# Patient Record
Sex: Male | Born: 1950 | Race: White | Hispanic: No | Marital: Married | Smoking: Never smoker
Health system: Southern US, Community
[De-identification: ages and names within clinical notes are randomized; demographics above are authoritative.]

## PROBLEM LIST (undated history)

## (undated) DIAGNOSIS — M722 Plantar fascial fibromatosis: Secondary | ICD-10-CM

## (undated) DIAGNOSIS — I82409 Acute embolism and thrombosis of unspecified deep veins of unspecified lower extremity: Secondary | ICD-10-CM

## (undated) DIAGNOSIS — I639 Cerebral infarction, unspecified: Secondary | ICD-10-CM

## (undated) DIAGNOSIS — I6529 Occlusion and stenosis of unspecified carotid artery: Secondary | ICD-10-CM

## (undated) DIAGNOSIS — K5790 Diverticulosis of intestine, part unspecified, without perforation or abscess without bleeding: Secondary | ICD-10-CM

## (undated) DIAGNOSIS — M7542 Impingement syndrome of left shoulder: Secondary | ICD-10-CM

## (undated) DIAGNOSIS — D6851 Activated protein C resistance: Secondary | ICD-10-CM

## (undated) DIAGNOSIS — E785 Hyperlipidemia, unspecified: Secondary | ICD-10-CM

## (undated) HISTORY — PX: VASECTOMY: SHX75

---

## 2006-11-21 ENCOUNTER — Emergency Department: Payer: Self-pay | Admitting: Unknown Physician Specialty

## 2008-03-28 ENCOUNTER — Emergency Department: Payer: Self-pay | Admitting: Emergency Medicine

## 2008-04-01 ENCOUNTER — Ambulatory Visit: Payer: Self-pay | Admitting: Internal Medicine

## 2008-04-06 ENCOUNTER — Ambulatory Visit: Payer: Self-pay | Admitting: Internal Medicine

## 2008-04-12 ENCOUNTER — Ambulatory Visit: Payer: Self-pay | Admitting: Internal Medicine

## 2008-04-18 ENCOUNTER — Ambulatory Visit: Payer: Self-pay | Admitting: Specialist

## 2008-04-20 ENCOUNTER — Ambulatory Visit: Payer: Self-pay | Admitting: Gastroenterology

## 2008-08-11 ENCOUNTER — Ambulatory Visit: Payer: Self-pay | Admitting: Specialist

## 2008-11-08 ENCOUNTER — Ambulatory Visit: Payer: Self-pay | Admitting: Specialist

## 2009-03-13 ENCOUNTER — Ambulatory Visit: Payer: Self-pay | Admitting: Specialist

## 2009-09-15 ENCOUNTER — Ambulatory Visit: Payer: Self-pay | Admitting: Specialist

## 2011-07-03 ENCOUNTER — Ambulatory Visit: Payer: Self-pay

## 2015-03-17 ENCOUNTER — Encounter
Admission: RE | Disposition: A | Discharge status: Home Health | Payer: Self-pay | Source: Ambulatory Visit | Attending: Gastroenterology

## 2015-03-17 ENCOUNTER — Ambulatory Visit
Admission: RE | Admit: 2015-03-17 | Discharge: 2015-03-17 | Disposition: A | Discharge status: Home Health | Payer: 59 | Source: Ambulatory Visit | Attending: Gastroenterology | Admitting: Gastroenterology

## 2015-03-17 ENCOUNTER — Encounter: Payer: Self-pay | Admitting: *Deleted

## 2015-03-17 ENCOUNTER — Ambulatory Visit: Payer: 59 | Admitting: Anesthesiology

## 2015-03-17 DIAGNOSIS — K635 Polyp of colon: Secondary | ICD-10-CM | POA: Diagnosis not present

## 2015-03-17 DIAGNOSIS — M722 Plantar fascial fibromatosis: Secondary | ICD-10-CM | POA: Insufficient documentation

## 2015-03-17 DIAGNOSIS — Z79899 Other long term (current) drug therapy: Secondary | ICD-10-CM | POA: Diagnosis not present

## 2015-03-17 DIAGNOSIS — E785 Hyperlipidemia, unspecified: Secondary | ICD-10-CM | POA: Diagnosis not present

## 2015-03-17 DIAGNOSIS — Z1211 Encounter for screening for malignant neoplasm of colon: Secondary | ICD-10-CM | POA: Insufficient documentation

## 2015-03-17 DIAGNOSIS — K5730 Diverticulosis of large intestine without perforation or abscess without bleeding: Secondary | ICD-10-CM | POA: Diagnosis not present

## 2015-03-17 DIAGNOSIS — K648 Other hemorrhoids: Secondary | ICD-10-CM | POA: Insufficient documentation

## 2015-03-17 HISTORY — DX: Plantar fascial fibromatosis: M72.2

## 2015-03-17 HISTORY — DX: Hyperlipidemia, unspecified: E78.5

## 2015-03-17 HISTORY — PX: COLONOSCOPY WITH PROPOFOL: SHX5780

## 2015-03-17 SURGERY — COLONOSCOPY WITH PROPOFOL
Anesthesia: General

## 2015-03-17 MED ORDER — PROPOFOL INFUSION 10 MG/ML OPTIME
INTRAVENOUS | Status: DC | PRN
Start: 2015-03-17 — End: 2015-03-17
  Administered 2015-03-17: 120 ug/kg/min via INTRAVENOUS

## 2015-03-17 MED ORDER — SODIUM CHLORIDE 0.9 % IV SOLN
INTRAVENOUS | Status: DC
  Administered 2015-03-17: 09:00:00 via INTRAVENOUS

## 2015-03-17 MED ORDER — LIDOCAINE HCL (CARDIAC) 20 MG/ML IV SOLN
INTRAVENOUS | Status: DC | PRN
  Administered 2015-03-17: 60 mg via INTRAVENOUS

## 2015-03-17 MED ORDER — PROPOFOL 10 MG/ML IV BOLUS
INTRAVENOUS | Status: DC | PRN
  Administered 2015-03-17: 60 mg via INTRAVENOUS

## 2015-03-17 NOTE — Transfer of Care (Signed)
Immediate Anesthesia Transfer of Care Note  Patient: Bryan Birch Sr.  Procedure(s) Performed: Procedure(s): COLONOSCOPY WITH PROPOFOL (N/A)  Patient Location: Endoscopy unit  Anesthesia Type:General  Level of Consciousness: awake, alert , oriented and patient cooperative  Airway & Oxygen Therapy: Patient Spontanous Breathing and Patient connected to nasal cannula oxygen  Post-op Assessment: Report given to RN, Post -op Vital signs reviewed and stable and Patient moving all extremities X 4  Post vital signs: Reviewed and stable  Last Vitals:  Filed Vitals:   03/17/15 0932  BP: 117/80  Pulse: 71  Temp: 35.7 C  Resp: 18    Complications: No apparent anesthesia complications

## 2015-03-17 NOTE — Anesthesia Postprocedure Evaluation (Signed)
  Anesthesia Post-op Note  Patient: Bryan Birch Sr.  Procedure(s) Performed: Procedure(s): COLONOSCOPY WITH PROPOFOL (N/A)  Anesthesia type:General  Patient location: PACU  Post pain: Pain level controlled  Post assessment: Post-op Vital signs reviewed, Patient's Cardiovascular Status Stable, Respiratory Function Stable, Patent Airway and No signs of Nausea or vomiting  Post vital signs: Reviewed and stable  Last Vitals:  Filed Vitals:   03/17/15 0932  BP: 117/80  Pulse: 71  Temp: 35.7 C  Resp: 18    Level of consciousness: awake, alert  and patient cooperative  Complications: No apparent anesthesia complications

## 2015-03-17 NOTE — H&P (Signed)
Outpatient short stay form Pre-procedure 03/17/2015 9:05 AM Christena Deem MD  Primary Physician: Dr. Bethann Punches  Reason for visit:  Screening colonoscopy  History of present illness:  Patient is a 63 year old male presenting for his first colonoscopy. His prep well. He takes no aspirin products. Takes no anticoagulates.    Current facility-administered medications:  .  0.9 %  sodium chloride infusion, , Intravenous, Continuous, Christena Deem, MD, Last Rate: 20 mL/hr at 03/17/15 0853 .  0.9 %  sodium chloride infusion, , Intravenous, Continuous, Christena Deem, MD  Prescriptions prior to admission  Medication Sig Dispense Refill Last Dose  . atorvastatin (LIPITOR) 10 MG tablet Take 10 mg by mouth daily.   03/16/2015 at Unknown time  . etodolac (LODINE) 200 MG capsule Take 200 mg by mouth 2 (two) times daily.   03/16/2015 at Unknown time     No Known Allergies   Past Medical History  Diagnosis Date  . Hyperlipidemia   . Plantar fasciitis     Review of systems:      Physical Exam    Heart and lungs: Regular rate and rhythm without rub or gallop, lungs are bilaterally clear    HEENT: Normocephalic atraumatic eyes are anicteric    Other:     Pertinant exam for procedure: Soft nontender nondistended bowel sounds positive normoactive mildly protuberant    Planned proceedures: Colonoscopy and indicated procedures I have discussed the risks benefits and complications of procedures to include not limited to bleeding, infection, perforation and the risk of sedation and the patient wishes to proceed.    Christena Deem, MD Gastroenterology 03/17/2015  9:05 AM

## 2015-03-17 NOTE — Op Note (Signed)
[facility]astroenterology Patient Name: Bryan Holland Procedure Date: 03/17/2015 9:00 AM MRN: [id number] Account #: [id number] Date of Birth: May 23, 1951 Admit Type: Outpatient Age: 64 Room: [facility] ENDO ROOM 3 Gender: Male Note Status: Finalized Procedure:         Colonoscopy Indications:       Screening for colorectal malignant neoplasm, This is the                     patient's first colonoscopy Providers:         Christena Deem, MD Referring MD:      [name], MD (Referring MD) Medicines:         Monitored Anesthesia Care Complications:     No immediate complications. Procedure:         Pre-Anesthesia Assessment:                    - ASA Grade Assessment: II - A patient with mild systemic                     disease.                    After obtaining informed consent, the colonoscope was                     passed under direct vision. Throughout the procedure, the                     patient's blood pressure, pulse, and oxygen saturations                     were monitored continuously. The Colonoscope was                     introduced through the anus and advanced to the the cecum,                     identified by appendiceal orifice and ileocecal valve. The                     colonoscopy was performed without difficulty. The patient                     tolerated the procedure well. The quality of the bowel                     preparation was good. Findings:      A few small-mouthed diverticula were found in the sigmoid colon.      A 3 mm polyp was found at the ileocecal valve. The polyp was sessile.       The polyp was removed with a cold biopsy forceps. Resection and       retrieval were complete.      Non-bleeding internal hemorrhoids were found during retroflexion. The       hemorrhoids were small. Impression:        - Diverticulosis in the sigmoid colon.                    - One 3 mm polyp at the ileocecal valve. Resected and            retrieved.                    - Non-bleeding internal hemorrhoids.  Recommendation:    - Use Analpram HC Cream 2.5%: Apply externally TID for 10                     days. Procedure Code(s): --- Professional ---                    [id number], Colonoscopy, flexible; with biopsy, single or                     multiple CPT copyright 2014 [employer]. All rights reserved. The codes documented in this report are preliminary and upon coder review may  be revised to meet current compliance requirements. Christena Deem, MD 03/17/2015 10:34:20 AM This report has been signed electronically. Number of Addenda: 0 Note Initiated On: 03/17/2015 9:00 AM Scope Withdrawal Time: 0 hours 10 minutes 23 seconds  Total Procedure Duration: 0 hours 15 minutes 23 seconds       [facility]

## 2015-03-17 NOTE — Anesthesia Preprocedure Evaluation (Signed)
Anesthesia Evaluation  Patient identified by MRN, date of birth, ID band Patient awake    Reviewed: Allergy & Precautions, NPO status , Patient's Chart, lab work & pertinent test results  History of Anesthesia Complications Negative for: history of anesthetic complications  Airway Mallampati: II  TM Distance: >3 FB Neck ROM: Full    Dental  (+) Partial Lower, Partial Upper   Pulmonary neg pulmonary ROS,          Cardiovascular negative cardio ROS      Neuro/Psych negative neurological ROS     GI/Hepatic negative GI ROS, Neg liver ROS,   Endo/Other  negative endocrine ROS  Renal/GU negative Renal ROS     Musculoskeletal   Abdominal   Peds  Hematology negative hematology ROS (+)   Anesthesia Other Findings   Reproductive/Obstetrics                             Anesthesia Physical Anesthesia Plan  ASA: II  Anesthesia Plan: General   Post-op Pain Management:    Induction: Intravenous  Airway Management Planned: Nasal Cannula  Additional Equipment:   Intra-op Plan:   Post-operative Plan:   Informed Consent: I have reviewed the patients History and Physical, chart, labs and discussed the procedure including the risks, benefits and alternatives for the proposed anesthesia with the patient or authorized representative who has indicated his/her understanding and acceptance.     Plan Discussed with:   Anesthesia Plan Comments:         Anesthesia Quick Evaluation

## 2015-03-20 LAB — SURGICAL PATHOLOGY

## 2017-06-21 DIAGNOSIS — I639 Cerebral infarction, unspecified: Secondary | ICD-10-CM

## 2017-06-21 HISTORY — DX: Cerebral infarction, unspecified: I63.9

## 2017-07-18 ENCOUNTER — Emergency Department: Payer: Medicare Other

## 2017-07-18 ENCOUNTER — Other Ambulatory Visit: Payer: Self-pay

## 2017-07-18 ENCOUNTER — Inpatient Hospital Stay
Admission: EM | Admit: 2017-07-18 | Discharge: 2017-07-21 | DRG: 065 | Disposition: A | Discharge status: Home / Self Care | Payer: Medicare Other | Attending: Internal Medicine | Admitting: Internal Medicine

## 2017-07-18 ENCOUNTER — Encounter: Payer: Self-pay | Admitting: Emergency Medicine

## 2017-07-18 DIAGNOSIS — I10 Essential (primary) hypertension: Secondary | ICD-10-CM | POA: Diagnosis present

## 2017-07-18 DIAGNOSIS — R29702 NIHSS score 2: Secondary | ICD-10-CM | POA: Diagnosis present

## 2017-07-18 DIAGNOSIS — I69351 Hemiplegia and hemiparesis following cerebral infarction affecting right dominant side: Secondary | ICD-10-CM

## 2017-07-18 DIAGNOSIS — I639 Cerebral infarction, unspecified: Principal | ICD-10-CM | POA: Diagnosis present

## 2017-07-18 DIAGNOSIS — I82412 Acute embolism and thrombosis of left femoral vein: Secondary | ICD-10-CM | POA: Diagnosis present

## 2017-07-18 DIAGNOSIS — E7800 Pure hypercholesterolemia, unspecified: Secondary | ICD-10-CM | POA: Diagnosis present

## 2017-07-18 DIAGNOSIS — E781 Pure hyperglyceridemia: Secondary | ICD-10-CM | POA: Diagnosis present

## 2017-07-18 DIAGNOSIS — I6523 Occlusion and stenosis of bilateral carotid arteries: Secondary | ICD-10-CM | POA: Diagnosis present

## 2017-07-18 DIAGNOSIS — D6851 Activated protein C resistance: Secondary | ICD-10-CM | POA: Diagnosis present

## 2017-07-18 DIAGNOSIS — I739 Peripheral vascular disease, unspecified: Secondary | ICD-10-CM | POA: Diagnosis present

## 2017-07-18 DIAGNOSIS — I63132 Cerebral infarction due to embolism of left carotid artery: Secondary | ICD-10-CM | POA: Diagnosis not present

## 2017-07-18 DIAGNOSIS — R609 Edema, unspecified: Secondary | ICD-10-CM

## 2017-07-18 DIAGNOSIS — I6529 Occlusion and stenosis of unspecified carotid artery: Secondary | ICD-10-CM | POA: Diagnosis present

## 2017-07-18 DIAGNOSIS — I6522 Occlusion and stenosis of left carotid artery: Secondary | ICD-10-CM | POA: Diagnosis not present

## 2017-07-18 HISTORY — DX: Activated protein C resistance: D68.51

## 2017-07-18 HISTORY — DX: Diverticulosis of intestine, part unspecified, without perforation or abscess without bleeding: K57.90

## 2017-07-18 LAB — COMPREHENSIVE METABOLIC PANEL WITH GFR
ALT: 33 U/L (ref 17–63)
AST: 29 U/L (ref 15–41)
Albumin: 4.4 g/dL (ref 3.5–5.0)
Alkaline Phosphatase: 84 U/L (ref 38–126)
Anion gap: 11 (ref 5–15)
BUN: 16 mg/dL (ref 6–20)
CO2: 20 mmol/L — ABNORMAL LOW (ref 22–32)
Calcium: 9.4 mg/dL (ref 8.9–10.3)
Chloride: 105 mmol/L (ref 101–111)
Creatinine, Ser: 1.15 mg/dL (ref 0.61–1.24)
GFR calc Af Amer: >60 mL/min
GFR calc non Af Amer: >60 mL/min
Glucose, Bld: 106 mg/dL — ABNORMAL HIGH (ref 65–99)
Potassium: 4 mmol/L (ref 3.5–5.1)
Sodium: 136 mmol/L (ref 135–145)
Total Bilirubin: 0.4 mg/dL (ref 0.3–1.2)
Total Protein: 7.6 g/dL (ref 6.5–8.1)

## 2017-07-18 LAB — DIFFERENTIAL
Basophils Absolute: 0.1 10*3/uL (ref 0–0.1)
Basophils Relative: 1 %
Eosinophils Absolute: 0.2 10*3/uL (ref 0–0.7)
Eosinophils Relative: 2 %
Lymphocytes Relative: 25 %
Lymphs Abs: 2 10*3/uL (ref 1.0–3.6)
Monocytes Absolute: 0.7 10*3/uL (ref 0.2–1.0)
Monocytes Relative: 9 %
Neutro Abs: 5.1 10*3/uL (ref 1.4–6.5)
Neutrophils Relative %: 63 %

## 2017-07-18 LAB — CBC
HCT: 51.3 % (ref 40.0–52.0)
Hemoglobin: 17.4 g/dL (ref 13.0–18.0)
MCH: 32 pg (ref 26.0–34.0)
MCHC: 33.9 g/dL (ref 32.0–36.0)
MCV: 94.4 fL (ref 80.0–100.0)
Platelets: 197 10*3/uL (ref 150–440)
RBC: 5.43 MIL/uL (ref 4.40–5.90)
RDW: 13.1 % (ref 11.5–14.5)
WBC: 8 10*3/uL (ref 3.8–10.6)

## 2017-07-18 LAB — PROTIME-INR
INR: 0.83
PROTHROMBIN TIME: 11.3 s — AB (ref 11.4–15.2)

## 2017-07-18 LAB — GLUCOSE, CAPILLARY: GLUCOSE-CAPILLARY: 102 mg/dL — AB (ref 65–99)

## 2017-07-18 LAB — APTT: aPTT: 25 s (ref 24–36)

## 2017-07-18 LAB — TROPONIN I: Troponin I: <0.03 ng/mL (ref <0.03)

## 2017-07-18 MED ORDER — ASPIRIN 81 MG PO CHEW
324.0000 mg | CHEWABLE_TABLET | Freq: Once | ORAL | Status: AC
  Administered 2017-07-18: 324 mg via ORAL
  Filled 2017-07-18: qty 4

## 2017-07-18 NOTE — ED Triage Notes (Signed)
See first nurse note. No pain at all. Only deficit is right arm weakness.

## 2017-07-18 NOTE — ED Notes (Signed)
Pt assisted up to bathroom then to w/c. Gait just slightly unsteady. Transported via w/c with telemetry on to room 120 on 1C.

## 2017-07-18 NOTE — ED Notes (Signed)
Patient transported to MRI 

## 2017-07-18 NOTE — H&P (Signed)
[facility] [place] at [facility]   PATIENT NAME: Bryan MethLarry Holland    MR#:  [id number]  DATE OF BIRTH:  [birth year]  DATE OF ADMISSION:  07/18/2017  PRIMARY CARE PHYSICIAN: Danella PentonMiller, Mark F, MD   REQUESTING/REFERRING PHYSICIAN: Lenard LancePaduchowski, MD  CHIEF COMPLAINT:   Chief Complaint  Patient presents with  . Extremity Weakness    HISTORY OF PRESENT ILLNESS:  Bryan Holland  is a 66 y.o. male who presents with right arm weakness.  Patient states that he has been having some intermittent episodes of waxing and waning weakness over the last couple of weeks.  However on the day of presentation in the ED he was unable to lift his arm significantly and unable to use his distal motor function very much at all.  Here in the ED he was found on MRI imaging to have left-sided multiple strokes and watershed regions.  Hospitalist were called for admission  PAST MEDICAL HISTORY:   Past Medical History:  Diagnosis Date  . Diverticulosis   . Factor V Leiden (HCC)   . Hyperlipidemia   . Plantar fasciitis     PAST SURGICAL HISTORY:   Past Surgical History:  Procedure Laterality Date  . COLONOSCOPY WITH PROPOFOL N/A 03/17/2015   Procedure: COLONOSCOPY WITH PROPOFOL;  Surgeon: Christena DeemMartin U Skulskie, MD;  [facility] ENDOSCOPY;  Service: Endoscopy;  Laterality: N/A;  . VASECTOMY      SOCIAL HISTORY:   Social History   Tobacco Use  . Smoking status: Never Smoker  . Smokeless tobacco: Former Engineer, water[employer]  Substance Use Topics  . Alcohol use: Not on file    FAMILY HISTORY:   Family History  Problem Relation Age of Onset  . CAD Mother   . Diabetes Mother   . CAD Father   . CAD Sister     DRUG ALLERGIES:  No Known Allergies  MEDICATIONS AT HOME:   Prior to Admission medications   Medication Sig Start Date End Date Taking? Authorizing Provider  atorvastatin (LIPITOR) 10 MG tablet Take 10 mg by mouth daily.    [provider]    REVIEW OF SYSTEMS:   Review of Systems  Constitutional: Negative for chills, fever, malaise/fatigue and weight loss.  HENT: Negative for ear pain, hearing loss and tinnitus.   Eyes: Negative for blurred vision, double vision, pain and redness.  Respiratory: Negative for cough, hemoptysis and shortness of breath.   Cardiovascular: Negative for chest pain, palpitations, orthopnea and leg swelling.  Gastrointestinal: Negative for abdominal pain, constipation, diarrhea, nausea and vomiting.  Genitourinary: Negative for dysuria, frequency and hematuria.  Musculoskeletal: Negative for back pain, joint pain and neck pain.  Skin:       No acne, rash, or lesions  Neurological: Positive for focal weakness. Negative for dizziness, tremors and weakness.  Endo/Heme/Allergies: Negative for polydipsia. Does not bruise/bleed easily.  Psychiatric/Behavioral: Negative for depression. The patient is not nervous/anxious and does not have insomnia.      VITAL SIGNS:   Vitals:   07/18/17 1636 07/18/17 1930 07/18/17 2100  BP: (!) 162/89 (!) 135/94 (!) 148/97  Pulse: 85 80 81  Resp: 18 15 19   Temp: 98.3 F (36.8 C)    TempSrc: Oral    SpO2: 95% 97% 97%  Weight: 88 kg (194 lb)    Height: 5\' 5"  (1.651 m)     Wt Readings from Last 3 Encounters:  07/18/17 88 kg (194 lb)  03/16/15 78.5 kg (173 lb)    PHYSICAL EXAMINATION:  Physical Exam  Vitals reviewed. Constitutional: He is oriented to person, place, and time. He appears well-developed and well-nourished. No distress.  HENT:  Head: Normocephalic and atraumatic.  Mouth/Throat: Oropharynx is clear and moist.  Eyes: Conjunctivae and EOM are normal. Pupils are equal, round, and reactive to light. No scleral icterus.  Neck: Normal range of motion. Neck supple. No JVD present. No thyromegaly present.  Cardiovascular: Normal rate, regular rhythm and intact distal pulses. Exam reveals no gallop and no friction rub.  No murmur heard. Respiratory: Effort normal and breath  sounds normal. No respiratory distress. He has no wheezes. He has no rales.  GI: Soft. Bowel sounds are normal. He exhibits no distension. There is no tenderness.  Musculoskeletal: Normal range of motion. He exhibits no edema.  No arthritis, no gout  Lymphadenopathy:    He has no cervical adenopathy.  Neurological: He is alert and oriented to person, place, and time. No cranial nerve deficit.  Neurologic: Cranial nerves II-XII intact, Sensation intact to light touch/pinprick, 5/5 strength in all extremities except for right upper extremity which has 3/5 proximal strength and 0/5 distal strength, no dysarthria, no aphasia, no dysphagia, memory intact  Skin: Skin is warm and dry. No rash noted. No erythema.  Psychiatric: He has a normal mood and affect. His behavior is normal. Judgment and thought content normal.    LABORATORY PANEL:   CBC Recent Labs  Lab 07/18/17 1644  WBC 8.0  HGB 17.4  HCT 51.3  PLT 197   ------------------------------------------------------------------------------------------------------------------  Chemistries  Recent Labs  Lab 07/18/17 1644  NA 136  K 4.0  CL 105  CO2 20*  GLUCOSE 106*  BUN 16  CREATININE 1.15  CALCIUM 9.4  AST 29  ALT 33  ALKPHOS 84  BILITOT 0.4   ------------------------------------------------------------------------------------------------------------------  Cardiac Enzymes Recent Labs  Lab 07/18/17 1644  TROPONINI <0.03   ------------------------------------------------------------------------------------------------------------------  RADIOLOGY:  Ct Head Wo Contrast  Result Date: 07/18/2017 CLINICAL DATA:  Progressive right arm weakness for 2 weeks. EXAM: CT HEAD WITHOUT CONTRAST TECHNIQUE: Contiguous axial images were obtained from the base of the skull through the vertex without intravenous contrast. COMPARISON:  07/03/2011 FINDINGS: Brain: There is no evidence of acute large territory infarct, intracranial  hemorrhage, mass, midline shift, or extra-axial fluid collection. The ventricles and sulci are normal. Scattered small foci of cerebral white matter hypodensity are new from the prior study and most notable in the subcortical left parietal lobe and left centrum semiovale. Vascular: No hyperdense vessel. Skull: No fracture or focal osseous lesion. Sinuses/Orbits: Visualized paranasal sinuses and mastoid air cells are clear. Visualized orbits are unremarkable. Other: None. IMPRESSION: 1. No evidence of acute cortically based infarct or intracranial hemorrhage. 2. New small foci of cerebral white matter hypodensity most notable in the left frontoparietal region, nonspecific though may reflect age-indeterminate small vessel ischemia. Electronically Signed   By: Sebastian AcheAllen  Grady M.D.   On: 07/18/2017 17:08   Mr Maxine GlennMra Head Wo Contrast  Result Date: 07/18/2017 CLINICAL DATA:  Right arm weakness EXAM: MRI HEAD WITHOUT CONTRAST MRA HEAD WITHOUT CONTRAST MRA NECK WITHOUT CONTRAST TECHNIQUE: Multiplanar, multiecho pulse sequences of the brain and surrounding structures were obtained without intravenous contrast. Angiographic images of the Circle of Daylyn Christine were obtained using MRA technique without intravenous contrast. Angiographic images of the neck were obtained using MRA technique without intravenous contrast. Carotid stenosis measurements (when applicable) are obtained utilizing NASCET criteria, using the distal internal carotid diameter as the denominator. COMPARISON:  Head CT 07/18/2017  FINDINGS: MRI HEAD FINDINGS Brain: The midline structures are normal. There is multifocal hyperintensity on diffusion-weighted imaging in the left hemisphere, along the margins between the anterior and middle cerebral arteries and margin between the middle and posterior cerebral arteries. ADC map shows varying degrees of corresponding diffusion restriction. There is corresponding hyperintense T2-weighted signal. Otherwise, the brain  parenchyma is normal. No mass lesion. No chronic microhemorrhage or cerebral amyloid angiopathy. No hydrocephalus, age advanced atrophy or lobar predominant volume loss. No dural abnormality or extra-axial collection. Skull and upper cervical spine: The visualized skull base, calvarium, upper cervical spine and extracranial soft tissues are normal. Sinuses/Orbits: No fluid levels or advanced mucosal thickening. No mastoid effusion. Normal orbits. MRA HEAD FINDINGS Intracranial internal carotid arteries: Normal. Anterior cerebral arteries: Normal. Middle cerebral arteries: Normal. Posterior communicating arteries: Present on the right. Posterior cerebral arteries: Normal. Basilar artery: Normal. Vertebral arteries: Left dominant. Normal. Superior cerebellar arteries: Normal. Anterior inferior cerebellar arteries: Poorly visualized. Posterior inferior cerebellar arteries: Normal. MRA NECK FINDINGS There is diminished flow related enhancement within both proximal internal carotid arteries with approximately 75% stenosis. Both vertebral arteries are normal caliber along their entire visualized length. There is a 3 vessel aortic arch branching pattern. IMPRESSION: 1. Numerous foci of acute ischemia within the left hemisphere along the left ACA-MCA and MCA-PCA watershed zones. The pattern is most consistent with a hypotensive/hypoperfusion infarct. 2. No hemorrhage or mass effect. 3. No emergent large vessel occlusion. 4. 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 Confirmation with CT angiography of the neck or carotid ultrasound is recommended, as this study is prone to artifacts. Electronically Signed   By: Deatra Robinson M.D.   On: 07/18/2017 21:22   Mr Maxine Glenn Neck Wo Contrast  Result Date: 07/18/2017 CLINICAL DATA:  Right arm weakness EXAM: MRI HEAD WITHOUT CONTRAST MRA HEAD WITHOUT CONTRAST MRA NECK WITHOUT CONTRAST TECHNIQUE: Multiplanar, multiecho pulse  sequences of the brain and surrounding structures were obtained without intravenous contrast. Angiographic images of the Circle of Nakota Ackert were obtained using MRA technique without intravenous contrast. Angiographic images of the neck were obtained using MRA technique without intravenous contrast. Carotid stenosis measurements (when applicable) are obtained utilizing NASCET criteria, using the distal internal carotid diameter as the denominator. COMPARISON:  Head CT 07/18/2017 FINDINGS: MRI HEAD FINDINGS Brain: The midline structures are normal. There is multifocal hyperintensity on diffusion-weighted imaging in the left hemisphere, along the margins between the anterior and middle cerebral arteries and margin between the middle and posterior cerebral arteries. ADC map shows varying degrees of corresponding diffusion restriction. There is corresponding hyperintense T2-weighted signal. Otherwise, the brain parenchyma is normal. No mass lesion. No chronic microhemorrhage or cerebral amyloid angiopathy. No hydrocephalus, age advanced atrophy or lobar predominant volume loss. No dural abnormality or extra-axial collection. Skull and upper cervical spine: The visualized skull base, calvarium, upper cervical spine and extracranial soft tissues are normal. Sinuses/Orbits: No fluid levels or advanced mucosal thickening. No mastoid effusion. Normal orbits. MRA HEAD FINDINGS Intracranial internal carotid arteries: Normal. Anterior cerebral arteries: Normal. Middle cerebral arteries: Normal. Posterior communicating arteries: Present on the right. Posterior cerebral arteries: Normal. Basilar artery: Normal. Vertebral arteries: Left dominant. Normal. Superior cerebellar arteries: Normal. Anterior inferior cerebellar arteries: Poorly visualized. Posterior inferior cerebellar arteries: Normal. MRA NECK FINDINGS There is diminished flow related enhancement within both proximal internal carotid arteries with approximately 75%  stenosis. Both vertebral arteries are normal caliber along their entire visualized length. There is a 3  vessel aortic arch branching pattern. IMPRESSION: 1. Numerous foci of acute ischemia within the left hemisphere along the left ACA-MCA and MCA-PCA watershed zones. The pattern is most consistent with a hypotensive/hypoperfusion infarct. 2. No hemorrhage or mass effect. 3. No emergent large vessel occlusion. 4. 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 Confirmation with CT angiography of the neck or carotid ultrasound is recommended, as this study is prone to artifacts. Electronically Signed   By: Deatra Robinson M.D.   On: 07/18/2017 21:22   Mr Brain Wo Contrast  Result Date: 07/18/2017 CLINICAL DATA:  Right arm weakness EXAM: MRI HEAD WITHOUT CONTRAST MRA HEAD WITHOUT CONTRAST MRA NECK WITHOUT CONTRAST TECHNIQUE: Multiplanar, multiecho pulse sequences of the brain and surrounding structures were obtained without intravenous contrast. Angiographic images of the Circle of Laury Huizar were obtained using MRA technique without intravenous contrast. Angiographic images of the neck were obtained using MRA technique without intravenous contrast. Carotid stenosis measurements (when applicable) are obtained utilizing NASCET criteria, using the distal internal carotid diameter as the denominator. COMPARISON:  Head CT 07/18/2017 FINDINGS: MRI HEAD FINDINGS Brain: The midline structures are normal. There is multifocal hyperintensity on diffusion-weighted imaging in the left hemisphere, along the margins between the anterior and middle cerebral arteries and margin between the middle and posterior cerebral arteries. ADC map shows varying degrees of corresponding diffusion restriction. There is corresponding hyperintense T2-weighted signal. Otherwise, the brain parenchyma is normal. No mass lesion. No chronic microhemorrhage or cerebral amyloid angiopathy. No  hydrocephalus, age advanced atrophy or lobar predominant volume loss. No dural abnormality or extra-axial collection. Skull and upper cervical spine: The visualized skull base, calvarium, upper cervical spine and extracranial soft tissues are normal. Sinuses/Orbits: No fluid levels or advanced mucosal thickening. No mastoid effusion. Normal orbits. MRA HEAD FINDINGS Intracranial internal carotid arteries: Normal. Anterior cerebral arteries: Normal. Middle cerebral arteries: Normal. Posterior communicating arteries: Present on the right. Posterior cerebral arteries: Normal. Basilar artery: Normal. Vertebral arteries: Left dominant. Normal. Superior cerebellar arteries: Normal. Anterior inferior cerebellar arteries: Poorly visualized. Posterior inferior cerebellar arteries: Normal. MRA NECK FINDINGS There is diminished flow related enhancement within both proximal internal carotid arteries with approximately 75% stenosis. Both vertebral arteries are normal caliber along their entire visualized length. There is a 3 vessel aortic arch branching pattern. IMPRESSION: 1. Numerous foci of acute ischemia within the left hemisphere along the left ACA-MCA and MCA-PCA watershed zones. The pattern is most consistent with a hypotensive/hypoperfusion infarct. 2. No hemorrhage or mass effect. 3. No emergent large vessel occlusion. 4. 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 Confirmation with CT angiography of the neck or carotid ultrasound is recommended, as this study is prone to artifacts. Electronically Signed   By: Deatra Robinson M.D.   On: 07/18/2017 21:22    EKG:   Orders placed or performed during the hospital encounter of 07/18/17  . ED EKG  . ED EKG    IMPRESSION AND PLAN:  Principal Problem:   Stroke [facility]) -admit per stroke admission order set.  Imaging already completed.  We will get appropriate labs, and consults including neurology consult, PT  and OT consults Active Problems:   Carotid artery stenosis -vascular surgery consult for further recommendation for treatment  All the records are reviewed and case discussed with ED provider. Management plans discussed with the patient and/or family.  DVT PROPHYLAXIS: SubQ lovenox  GI PROPHYLAXIS: None  ADMISSION STATUS: Inpatient  CODE  STATUS: Full Code Status History    This patient does not have a recorded code status. Please follow your organizational policy for patients in this situation.      TOTAL TIME TAKING CARE OF THIS PATIENT: 45 minutes.   Shyana Kulakowski FIELDING 07/18/2017, 10:43 PM  [facility]  Office  [phone]  CC: Primary care physician; [name], MD  Note:  This document was prepared using Dragon voice recognition software and may include unintentional dictation errors.

## 2017-07-18 NOTE — ED Notes (Signed)
Patient reports he is unable to raise his right  arm using his elbow more than 18" off the bed while sitting up. Unable to squeeze my hand using his right hand.

## 2017-07-18 NOTE — ED Triage Notes (Signed)
FIRST NURSE NOTE-pt sent from dr Hyacinth Meekermiller. Has no use at all of right arm. No pain just unable to use. Cannot grip.  Sx progressively worse X 2 weeks.  No facial droop.  Apparent weakness noted to right arm. No other weakness.  Seen at urgent care for start of sx and given gabapentin then saw miller today.

## 2017-07-18 NOTE — ED Provider Notes (Addendum)
[facility] Emergency Department Provider Note  Time seen: 5:40 PM  I have reviewed the triage vital signs and the nursing notes.   HISTORY  Chief Complaint Extremity Weakness    HPI Bryan Holland Sr. is a 66 y.o. male with a past medical history of hyperlipidemia who presents to the emergency department for right arm weakness.  According to the patient for the past 2 weeks he has had progressive weakness in the right upper extremity.  He states just yesterday he was still able to put his belt on but with significant difficulty.  Today he has no function in the right upper extremity below the elbow.  Patient states normal sensation.  Denies any headache.  Denies any other weakness or numbness.  Denies any family history of MS.  He denies any prior weakness or numbness symptoms.  Patient's review of systems is negative.   Past Medical History:  Diagnosis Date  . Hyperlipidemia   . Plantar fasciitis     There are no active problems to display for this patient.   Past Surgical History:  Procedure Laterality Date  . COLONOSCOPY WITH PROPOFOL N/A 03/17/2015   Procedure: COLONOSCOPY WITH PROPOFOL;  Surgeon: Christena Deem, MD;  Location: [facility] ENDOSCOPY;  Service: Endoscopy;  Laterality: N/A;  . VASECTOMY      Prior to Admission medications   Medication Sig Start Date End Date Taking? Authorizing Provider  atorvastatin (LIPITOR) 10 MG tablet Take 10 mg by mouth daily.    [provider]  etodolac (LODINE) 200 MG capsule Take 200 mg by mouth 2 (two) times daily.    [provider]    No Known Allergies  History reviewed. No pertinent family history.  Social History Social History   Tobacco Use  . Smoking status: Never Smoker  . Smokeless tobacco: Former Engineer, water Use Topics  . Alcohol use: Not on file  . Drug use: No    Review of Systems Constitutional: Negative for fever. Eyes: Negative for visual changes. ENT:  Negative for congestion Cardiovascular: Negative for chest pain. Respiratory: Negative for shortness of breath. Gastrointestinal: Negative for abdominal pain Genitourinary: Negative for dysuria. Musculoskeletal: Negative for back pain. Skin: Negative for rash. Neurological: Negative for headaches.  Positive for right arm weakness. All other ROS negative  ____________________________________________   PHYSICAL EXAM:  VITAL SIGNS: ED Triage Vitals [07/18/17 1636]  Enc Vitals Group     BP (!) 162/89     Pulse Rate 85     Resp 18     Temp 98.3 F (36.8 C)     Temp Source Oral     SpO2 95 %     Weight 194 lb (88 kg)     Height 5\' 5"  (1.651 m)     Head Circumference      Peak Flow      Pain Score      Pain Loc      Pain Edu?      Excl. in GC?    Constitutional: Alert and oriented. Well appearing and in no distress. Eyes: Normal exam ENT   Head: Normocephalic and atraumatic.   Mouth/Throat: Mucous membranes are moist. Cardiovascular: Normal rate, regular rhythm. No murmur Respiratory: Normal respiratory effort without tachypnea nor retractions. Breath sounds are clear Gastrointestinal: Soft and nontender. No distention.  Musculoskeletal: Nontender with normal range of motion in all extremities.  Neurologic:  Normal speech and language.  Patient has significant weakness of the right upper  extremity, no grip strength on exam, significant drift.  States sensation is normal.  Cranial nerves are intact. Skin:  Skin is warm, dry and intact.  Psychiatric: Mood and affect are normal.   ____________________________________________    EKG  EKG reviewed and interpreted by myself shows normal sinus rhythm at 91 bpm with a narrow QRS, normal axis, normal intervals, no concerning ST changes.  ____________________________________________    RADIOLOGY  CT head shows possible left frontoparietal lesion otherwise negative.  Negative for acute  infarct.  ____________________________________________   INITIAL IMPRESSION / ASSESSMENT AND PLAN / ED COURSE  Pertinent labs & imaging results that were available during my care of the patient were reviewed by me and considered in my medical decision making (see chart for details).  Patient presents to the emergency department for right arm weakness progressively worse for the past 2 weeks.  On exam he has significant right arm weakness otherwise normal neurological examination.  Patient's physical exam is otherwise normal.  Differential would include CVA, ICH, MS, peripheral neuropathy.  Patient denies any pain to the upper extremity.  Patient CT scan of the head is negative for acute infarct but does show a small lesion to the left frontoparietal area.  As the patient symptoms are on the right we will obtain an MRI of the head and neck to further evaluate.  Patient agreeable to this plan of care.  NIH Stroke Scale   Interval: Baseline Time: 9:43 PM Person Administering Scale: Ned Kakar  Administer stroke scale items in the order listed. Record performance in each category after each subscale exam. Do not go back and change scores. Follow directions provided for each exam technique. Scores should reflect what the patient does, not what the clinician thinks the patient can do. The clinician should record answers while administering the exam and work quickly. Except where indicated, the patient should not be coached (i.e., repeated requests to patient to make a special effort).   1a  Level of consciousness: 0=alert; keenly responsive  1b. LOC questions:  0=Performs both tasks correctly  1c. LOC commands: 0=Performs both tasks correctly  2.  Best Gaze: 0=normal  3.  Visual: 0=No visual loss  4. Facial Palsy: 0=Normal symmetric movement  5a.  Motor left arm: 0=No drift, limb holds 90 (or 45) degrees for full 10 seconds  5b.  Motor right arm: 3=No effort against gravity, limb falls   6a. motor left leg: 0=No drift, limb holds 90 (or 45) degrees for full 10 seconds  6b  Motor right leg:  0=No drift, limb holds 90 (or 45) degrees for full 10 seconds  7. Limb Ataxia: 1=Present in one limb  8.  Sensory: 0=Normal; no sensory loss  9. Best Language:  0=No aphasia, normal  10. Dysarthria: 0=Normal  11. Extinction and Inattention: 0=No abnormality  12. Distal motor function: 2=No voluntary extension after 5 seconds. 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  I discussed this with the patient he denies any hypotension known to the patient.  No drug use.  Patient states he has noticed that when he stretches and stretches his head back that he becomes lightheaded in fact one time fell to the ground because of lightheadedness.  Patient does admit to occasional alcohol use.  I would imagine that the patient  could experience/watershed infarcts if he were to fall asleep for a prolonged period in a position that could obstruct flow to the left carotid given the significant stenosis.  Regardless patient will require admission to the hospital for further workup and treatment.  Patient agreeable to this plan of care.  We will cover with aspirin until further recommendations are made.    ____________________________________________   FINAL CLINICAL IMPRESSION(S) / ED DIAGNOSES  Right arm weakness CVA   Minna AntisPaduchowski, Nelia Rogoff, MD 07/18/17 [id number]    Minna AntisPaduchowski, Lattie Riege, MD 07/18/17 2144

## 2017-07-19 ENCOUNTER — Inpatient Hospital Stay: Payer: Medicare Other

## 2017-07-19 DIAGNOSIS — I6522 Occlusion and stenosis of left carotid artery: Secondary | ICD-10-CM

## 2017-07-19 DIAGNOSIS — I639 Cerebral infarction, unspecified: Principal | ICD-10-CM

## 2017-07-19 DIAGNOSIS — I63132 Cerebral infarction due to embolism of left carotid artery: Secondary | ICD-10-CM

## 2017-07-19 LAB — LIPID PANEL
Cholesterol: 260 mg/dL — ABNORMAL HIGH (ref 0–200)
HDL: 45 mg/dL (ref >40)
LDL CALC: 157 mg/dL — AB (ref 0–99)
Total CHOL/HDL Ratio: 5.8 RATIO
Triglycerides: 290 mg/dL — ABNORMAL HIGH (ref <150)
VLDL: 58 mg/dL — AB (ref 0–40)

## 2017-07-19 LAB — HEMOGLOBIN A1C
HEMOGLOBIN A1C: 5.2 % (ref 4.8–5.6)
Mean Plasma Glucose: 102.54 mg/dL

## 2017-07-19 MED ORDER — ASPIRIN EC 81 MG PO TBEC
81.0000 mg | DELAYED_RELEASE_TABLET | Freq: Every day | ORAL | Status: DC
  Administered 2017-07-19: 81 mg via ORAL
  Filled 2017-07-19: qty 1

## 2017-07-19 MED ORDER — ACETAMINOPHEN 650 MG RE SUPP: 650.0000 mg | RECTAL | Status: DC | PRN

## 2017-07-19 MED ORDER — ATORVASTATIN CALCIUM 20 MG PO TABS
40.0000 mg | ORAL_TABLET | Freq: Every day | ORAL | Status: DC
  Administered 2017-07-20 – 2017-07-21 (×2): 40 mg via ORAL
  Filled 2017-07-19 (×2): qty 2

## 2017-07-19 MED ORDER — ENOXAPARIN SODIUM 40 MG/0.4ML ~~LOC~~ SOLN: 40.0000 mg | SUBCUTANEOUS | Status: DC

## 2017-07-19 MED ORDER — ACETAMINOPHEN 325 MG PO TABS
650.0000 mg | ORAL_TABLET | ORAL | Status: DC | PRN
  Administered 2017-07-20 (×2): 650 mg via ORAL
  Filled 2017-07-19 (×2): qty 2

## 2017-07-19 MED ORDER — SODIUM CHLORIDE 0.9 % IV SOLN
INTRAVENOUS | Status: DC
  Administered 2017-07-19: 16:00:00 via INTRAVENOUS

## 2017-07-19 MED ORDER — CLOPIDOGREL BISULFATE 75 MG PO TABS
75.0000 mg | ORAL_TABLET | Freq: Every day | ORAL | Status: DC
  Administered 2017-07-19: 75 mg via ORAL
  Filled 2017-07-19: qty 1

## 2017-07-19 MED ORDER — SODIUM CHLORIDE 0.9% FLUSH
3.0000 mL | Freq: Two times a day (BID) | INTRAVENOUS | Status: DC
  Administered 2017-07-19 – 2017-07-21 (×5): 3 mL via INTRAVENOUS

## 2017-07-19 MED ORDER — STROKE: EARLY STAGES OF RECOVERY BOOK
Freq: Once | Status: AC
  Administered 2017-07-19: 01:00:00

## 2017-07-19 MED ORDER — APIXABAN 5 MG PO TABS
5.0000 mg | ORAL_TABLET | Freq: Two times a day (BID) | ORAL | Status: DC
  Administered 2017-07-19 – 2017-07-21 (×4): 5 mg via ORAL
  Filled 2017-07-19 (×4): qty 1

## 2017-07-19 MED ORDER — ACETAMINOPHEN 160 MG/5ML PO SOLN: 650.0000 mg | ORAL | Status: DC | PRN

## 2017-07-19 MED ORDER — ASPIRIN 325 MG PO TABS
325.0000 mg | ORAL_TABLET | Freq: Every day | ORAL | Status: DC
  Filled 2017-07-19: qty 1

## 2017-07-19 MED ORDER — ATORVASTATIN CALCIUM 20 MG PO TABS
10.0000 mg | ORAL_TABLET | Freq: Every day | ORAL | Status: DC
  Administered 2017-07-19: 08:00:00 10 mg via ORAL
  Filled 2017-07-19: qty 1

## 2017-07-19 NOTE — Evaluation (Signed)
Occupational Therapy Evaluation Patient Details Name: Wynema BirchLarry A Armetta Sr. MRN: [id number] DOB: 26-Jun-1951 Today's Date: 07/19/2017    History of Present Illness Seward MethLarry Homeyer  is a 66 y.o. male who presents with right arm weakness.  Patient states that he has been having some intermittent episodes of waxing and waning weakness over the last couple of weeks.  However on the day of presentation in the ED he was unable to lift his arm significantly and unable to use his distal motor function very much at all.  Here in the ED he was found on MRI imaging to have left-sided multiple strokes and watershed regions.   Clinical Impression   Patient seen for OT evaluation this date.  Patient lives in a 2nd story apartment with his wife in [place], he was previously independent with all tasks including driving and IADLs, he is retired.  Patient was admitted for work up for CVA, presents with hemiplegia/hemiparesis of RUE, denies any other deficits.  Patient presents with limited active movement in RUE throughout, gross motor impaired, ROM impaired, fine motor coordination absent.  Patient does have sensation for light touch and temperature, mild edema present.   Initiation of finger movements and thumb on the right but not functional for any grasp or pinch.  He would benefit from skilled OT to maximize safety and independence in daily tasks.  Recommend OP OT upon discharge for RUE neuro reeducation.     Follow Up Recommendations  Outpatient OT    Equipment Recommendations       Recommendations for Other Services       Precautions / Restrictions Precautions Precaution Comments: Decreased active movement in RUE but still has sensation intact      Mobility Bed Mobility Overal bed mobility: Independent                Transfers                      Balance Overall balance assessment: Independent                                         ADL either performed or  assessed with clinical judgement   ADL Overall ADL's : Needs assistance/impaired   Eating/Feeding Details (indicate cue type and reason): Patient is left handed and can use left hand to perform tasks, unable to perform bilateral hand activities such as cutting food.  Grooming: Minimal assistance Grooming Details (indicate cue type and reason): unable to perform with right hand, able to do limited tasks with left hand Upper Body Bathing: Minimal assistance Upper Body Bathing Details (indicate cue type and reason): right hand unable to use for washing left body, able to use left hand for rest of task.  Lower Body Bathing: Minimal assistance   Upper Body Dressing : Minimal assistance   Lower Body Dressing: Minimal assistance Lower Body Dressing Details (indicate cue type and reason): difficulty with socks, instructed on one handed sock donning and able to complete with cues.  Toilet Transfer: Modified Independent           Functional mobility during ADLs: Independent General ADL Comments: no AD used for mobility.  Deficits are RUE only, patient would benefit from OP OT to further address limitations.      Vision Patient Visual Report: No change from baseline       Perception  Praxis      Pertinent Vitals/Pain Pain Assessment: No/denies pain     Hand Dominance Left   Extremity/Trunk Assessment Upper Extremity Assessment Upper Extremity Assessment: RUE deficits/detail RUE Deficits / Details: Patient demonstrates decreased active movement in RUE as follows:  Shoulder flexion tends to move more in a flex/ABD combo to 90 degrees of motion, elbow flexion to 125 degrees, elbow extension -20 actively.  Full passive ROM in all joints.  Patient has a flicker of movement for finger flexion, no extension.  Thumb has initiation of movement for flexion, extension, ABD but no ADD actively.  Sensation intact but patient reports arm feels heavy and "dead".  Gross motor impaired, fine motor  absent.  Shoulder elevation intact but slow to complete last 1/3 of movement, scapular retraction intact.  No oppositional movements present at eval. Denies any tingling.  Response to Temperature intact.  RUE Coordination: decreased fine motor;decreased gross motor   Lower Extremity Assessment Lower Extremity Assessment: Overall WFL for tasks assessed   Cervical / Trunk Assessment Cervical / Trunk Assessment: Normal   Communication Communication Communication: No difficulties   Cognition Arousal/Alertness: Awake/alert Behavior During Therapy: WFL for tasks assessed/performed Overall Cognitive Status: Within Functional Limits for tasks assessed                                 General Comments: no changes in cognition since event   General Comments       Exercises  Patient seen for self care tasks and instructions on how to incorporate movements on R side during tasks, for example, weight bearing on right UE while at the sink, reaching with right hand to activate faucet lever. Patient instructed on edema control measures with positioning and activation of muscle pump in RUE as much as possible.  Patient educated on using hand as much as possible even if using left UE to assist to take RUE through the motions for daily activities. Patient and wife able to demo understanding and plans to work on exercises over the next few days.    Shoulder Instructions      Home Living Family/patient expects to be discharged to:: Private residence Living Arrangements: Spouse/significant other Available Help at Discharge: Family Type of Home: Apartment Home Access: Other (comment);Stairs to enter [employer] CorporationEntrance Stairs-Number of Steps: lives on 2nd story of apartment with 16 steps to enter no elevator.  Lives in [place]   Home Layout: One level     Bathroom Shower/Tub: Tub/shower unit;Curtain   FirefighterBathroom Toilet: Pharmacist, communitytandard Bathroom Accessibility: Yes   Home Equipment: None           Prior Functioning/Environment Level of Independence: Independent                 OT Problem List: Decreased strength;Decreased knowledge of use of DME or AE;Impaired UE functional use;Increased edema;Decreased range of motion;Decreased coordination      OT Treatment/Interventions: Self-care/ADL training;DME and/or AE instruction;Therapeutic activities;Therapeutic exercise;Neuromuscular education;Patient/family education    OT Goals(Current goals can be found in the care plan section) Acute Rehab OT Goals Patient Stated Goal: to be back to normal, use my right arm again OT Goal Formulation: With patient/family Time For Goal Achievement: 07/26/17 Potential to Achieve Goals: Good ADL Goals Pt Will Perform Upper Body Dressing: (P) with modified independence Pt Will Perform Lower Body Dressing: (P) with modified independence Pt/caregiver will Perform Home Exercise Program: (P) With written HEP provided  OT Frequency:  Min 1X/week   Barriers to D/C:            Co-evaluation              AM-PAC PT "6 Clicks" Daily Activity     Outcome Measure Help from another person eating meals?: A Little Help from another person taking care of personal grooming?: A Little Help from another person toileting, which includes using toliet, bedpan, or urinal?: A Little Help from another person bathing (including washing, rinsing, drying)?: A Little Help from another person to put on and taking off regular upper body clothing?: A Little Help from another person to put on and taking off regular lower body clothing?: A Little 6 Click Score: 18   End of Session    Activity Tolerance: Patient tolerated treatment well Patient left: in bed;with call bell/phone within reach;with family/visitor present  OT Visit Diagnosis: Muscle weakness (generalized) (M62.81);Hemiplegia and hemiparesis;Other (comment)(lack of coordination, ADL deficits) Hemiplegia - Right/Left: Right Hemiplegia -  dominant/non-dominant: Non-Dominant Hemiplegia - caused by: Cerebral infarction                Time: 1200-1240 OT Time Calculation (min): 40 min Charges:  OT General Charges $OT Visit: 1 Visit OT Evaluation $OT Eval Low Complexity: 1 Low OT Treatments $Self Care/Home Management : 23-37 mins G-Codes: OT G-codes **NOT FOR INPATIENT CLASS** Functional Assessment Tool Used: AM-PAC 6 Clicks Daily Activity Functional Limitation: Self care Self Care Current Status (Z6109): At least 40 percent but less than 60 percent impaired, limited or restricted Self Care Goal Status (U0454): At least 20 percent but less than 40 percent impaired, limited or restricted   Amy T Lovett, OTR/L, CLT   Lovett,Amy 07/19/2017, 1:02 PM

## 2017-07-19 NOTE — Progress Notes (Signed)
Patient ID: Bryan BirchLarry A Huhta Sr., male   DOB: [birth year], 66 y.o.   MRN: [id number]  Ultrasound lower extremity positive for acute DVT left lower extremity. Case discussed with Dr. Thad Rangereynolds neurology and she would like to do 5 mg of Eliquis twice daily and not the higher dose at this point because of the acute stroke.  Dr. Alford Highlandichard Rajiv Parlato

## 2017-07-19 NOTE — Evaluation (Signed)
SLP Screening  Patient Details Name: Bryan BirchLarry A Marsch Sr. MRN: [id number] DOB: 1951-07-09  SLP performed screening of pt and discussed with NSG. Pt is at baseline with no complaints of difficulty with speech or swallowing. SLP educated pt and NSG to contact SLP if changes in status occur. No evaluation required at this time.                                                                                                     Bryan PelStacie Harris Holland 07/19/2017, 10:10 AM

## 2017-07-19 NOTE — Consult Note (Signed)
Referring Physician: Renae Gloss    Chief Complaint: Right arm weakness  HPI: Bryan Birch Sr. is a LH 66 y.o. male with a history of factor V leiden and DVT, previously on Coumadin discontinued after a few months, who reports that for the past couple of weeks he has been experiencing right arm weakness.  It has waxed and waned but not been at his baseline for this period of time.  On yesterday he noted that he was unable to lift his right arm well and was unable to perform fine motor movements.  Patient presented to the ED at that time.  Initial NIHSS of 2.   Date last known well: Unable to determine Time last known well: Unable to determine tPA Given: No: Unable to determine LKW, outside treatment window  Past Medical History:  Diagnosis Date  . Diverticulosis   . Factor V Leiden (HCC)   . Hyperlipidemia   . Plantar fasciitis     Past Surgical History:  Procedure Laterality Date  . COLONOSCOPY WITH PROPOFOL N/A 03/17/2015   Procedure: COLONOSCOPY WITH PROPOFOL;  Surgeon: Christena Deem, MD;  Location: [facility] ENDOSCOPY;  Service: Endoscopy;  Laterality: N/A;  . VASECTOMY      Family History  Problem Relation Age of Onset  . CAD Mother   . Diabetes Mother   . CAD Father   . CAD Sister    Social History:  reports that  has never smoked. He has quit using smokeless tobacco. He reports that he does not use drugs. His alcohol history is not on file.  Allergies: No Known Allergies  Medications:  I have reviewed the patient's current medications. Prior to Admission:  Medications Prior to Admission  Medication Sig Dispense Refill Last Dose  . atorvastatin (LIPITOR) 10 MG tablet Take 10 mg by mouth daily.   03/16/2015 at Unknown time   Scheduled: . apixaban  5 mg Oral BID  . aspirin EC  81 mg Oral Daily  . [START ON 07/20/2017] atorvastatin  40 mg Oral Daily  . sodium chloride flush  3 mL Intravenous Q12H    ROS: History obtained from the patient  General ROS: negative for  - chills, fatigue, fever, night sweats, weight gain or weight loss Psychological ROS: negative for - behavioral disorder, hallucinations, memory difficulties, mood swings or suicidal ideation Ophthalmic ROS: negative for - blurry vision, double vision, eye pain or loss of vision ENT ROS: negative for - epistaxis, nasal discharge, oral lesions, sore throat, tinnitus or vertigo Allergy and Immunology ROS: negative for - hives or itchy/watery eyes Hematological and Lymphatic ROS: negative for - bleeding problems, bruising or swollen lymph nodes Endocrine ROS: negative for - galactorrhea, hair pattern changes, polydipsia/polyuria or temperature intolerance Respiratory ROS: negative for - cough, hemoptysis, shortness of breath or wheezing Cardiovascular ROS: negative for - chest pain, dyspnea on exertion, edema or irregular heartbeat Gastrointestinal ROS: negative for - abdominal pain, diarrhea, hematemesis, nausea/vomiting or stool incontinence Genito-Urinary ROS: negative for - dysuria, hematuria, incontinence or urinary frequency/urgency Musculoskeletal ROS: negative for - joint swelling or muscular weakness Neurological ROS: as noted in HPI Dermatological ROS: negative for rash and skin lesion changes  Physical Examination: Blood pressure (!) 148/92, pulse 87, temperature 98 F (36.7 C), temperature source Oral, resp. rate 20, height 5\' 5"  (1.651 m), weight 88 kg (194 lb), SpO2 98 %.  HEENT-  Normocephalic, no lesions, without obvious abnormality.  Normal external eye and conjunctiva.  Normal TM's bilaterally.  Normal auditory canals and  external ears. Normal external nose, mucus membranes and septum.  Normal pharynx. Cardiovascular- S1, S2 normal, pulses palpable throughout   Lungs- chest clear, no wheezing, rales, normal symmetric air entry Abdomen- soft, non-tender; bowel sounds normal; no masses,  no organomegaly Extremities- no edema Lymph-no adenopathy palpable Musculoskeletal-no joint  tenderness, deformity or swelling Skin-warm and dry, no hyperpigmentation, vitiligo, or suspicious lesions  Neurological Examination   Mental Status: Alert, oriented, thought content appropriate.  Speech fluent without evidence of aphasia.  Able to follow 3 step commands without difficulty. Cranial Nerves: II: Discs flat bilaterally; Visual fields grossly normal, pupils equal, round, reactive to light and accommodation III,IV, VI: ptosis not present, extra-ocular motions intact bilaterally V,VII: smile symmetric, facial light touch sensation normal bilaterally VIII: hearing normal bilaterally IX,X: gag reflex present XI: bilateral shoulder shrug XII: midline tongue extension Motor: Right : Upper extremity   3/5 proximally, 0/5 distally   Left:     Upper extremity   5/5  Lower extremity   5/5       Lower extremity   5/5 Tone and bulk:normal tone throughout; no atrophy noted Sensory: Pinprick and light touch intact throughout, bilaterally Deep Tendon Reflexes: 2+ and symmetric with absent AJ's bilaterally Plantars: Right: mute   Left: mute Cerebellar: Normal finger-to-nose testing on the left and normal heel-to-shin testing bilaterally Gait: not tested due to safety concerns    Laboratory Studies:  Basic Metabolic Panel: Recent Labs  Lab 07/18/17 1644  NA 136  K 4.0  CL 105  CO2 20*  GLUCOSE 106*  BUN 16  CREATININE 1.15  CALCIUM 9.4    Liver Function Tests: Recent Labs  Lab 07/18/17 1644  AST 29  ALT 33  ALKPHOS 84  BILITOT 0.4  PROT 7.6  ALBUMIN 4.4   No results for input(s): LIPASE, AMYLASE in the last 168 hours. No results for input(s): AMMONIA in the last 168 hours.  CBC: Recent Labs  Lab 07/18/17 1644  WBC 8.0  NEUTROABS 5.1  HGB 17.4  HCT 51.3  MCV 94.4  PLT 197    Cardiac Enzymes: Recent Labs  Lab 07/18/17 1644  TROPONINI <0.03    BNP: Invalid input(s): POCBNP  CBG: Recent Labs  Lab 07/18/17 1647  GLUCAP 102*     Microbiology: No results found for this or any previous visit.  Coagulation Studies: Recent Labs    07/18/17 1644  LABPROT 11.3*  INR 0.83    Urinalysis: No results for input(s): COLORURINE, LABSPEC, PHURINE, GLUCOSEU, HGBUR, BILIRUBINUR, KETONESUR, PROTEINUR, UROBILINOGEN, NITRITE, LEUKOCYTESUR in the last 168 hours.  Invalid input(s): APPERANCEUR  Lipid Panel:    Component Value Date/Time   CHOL 260 (H) 07/19/2017 0328   TRIG 290 (H) 07/19/2017 0328   HDL 45 07/19/2017 0328   CHOLHDL 5.8 07/19/2017 0328   VLDL 58 (H) 07/19/2017 0328   LDLCALC 157 (H) 07/19/2017 0328    HgbA1C:  Lab Results  Component Value Date   HGBA1C 5.2 07/19/2017    Urine Drug Screen:  No results found for: LABOPIA, COCAINSCRNUR, LABBENZ, AMPHETMU, THCU, LABBARB  Alcohol Level: No results for input(s): ETH in the last 168 hours.  Other results: EKG: normal sinus rhythm at 91 bpm.  Imaging: Ct Head Wo Contrast  Result Date: 07/18/2017 CLINICAL DATA:  Progressive right arm weakness for 2 weeks. EXAM: CT HEAD WITHOUT CONTRAST TECHNIQUE: Contiguous axial images were obtained from the base of the skull through the vertex without intravenous contrast. COMPARISON:  07/03/2011 FINDINGS: Brain: There is no  evidence of acute large territory infarct, intracranial hemorrhage, mass, midline shift, or extra-axial fluid collection. The ventricles and sulci are normal. Scattered small foci of cerebral white matter hypodensity are new from the prior study and most notable in the subcortical left parietal lobe and left centrum semiovale. Vascular: No hyperdense vessel. Skull: No fracture or focal osseous lesion. Sinuses/Orbits: Visualized paranasal sinuses and mastoid air cells are clear. Visualized orbits are unremarkable. Other: None. IMPRESSION: 1. No evidence of acute cortically based infarct or intracranial hemorrhage. 2. New small foci of cerebral white matter hypodensity most notable in the left  frontoparietal region, nonspecific though may reflect age-indeterminate small vessel ischemia. Electronically Signed   By: Sebastian Ache M.D.   On: 07/18/2017 17:08   Mr Maxine Glenn Head Wo Contrast  Result Date: 07/18/2017 CLINICAL DATA:  Right arm weakness EXAM: MRI HEAD WITHOUT CONTRAST MRA HEAD WITHOUT CONTRAST MRA NECK WITHOUT CONTRAST TECHNIQUE: Multiplanar, multiecho pulse sequences of the brain and surrounding structures were obtained without intravenous contrast. Angiographic images of the Circle of Willis were obtained using MRA technique without intravenous contrast. Angiographic images of the neck were obtained using MRA technique without intravenous contrast. Carotid stenosis measurements (when applicable) are obtained utilizing NASCET criteria, using the distal internal carotid diameter as the denominator. COMPARISON:  Head CT 07/18/2017 FINDINGS: MRI HEAD FINDINGS Brain: The midline structures are normal. There is multifocal hyperintensity on diffusion-weighted imaging in the left hemisphere, along the margins between the anterior and middle cerebral arteries and margin between the middle and posterior cerebral arteries. ADC map shows varying degrees of corresponding diffusion restriction. There is corresponding hyperintense T2-weighted signal. Otherwise, the brain parenchyma is normal. No mass lesion. No chronic microhemorrhage or cerebral amyloid angiopathy. No hydrocephalus, age advanced atrophy or lobar predominant volume loss. No dural abnormality or extra-axial collection. Skull and upper cervical spine: The visualized skull base, calvarium, upper cervical spine and extracranial soft tissues are normal. Sinuses/Orbits: No fluid levels or advanced mucosal thickening. No mastoid effusion. Normal orbits. MRA HEAD FINDINGS Intracranial internal carotid arteries: Normal. Anterior cerebral arteries: Normal. Middle cerebral arteries: Normal. Posterior communicating arteries: Present on the right. Posterior  cerebral arteries: Normal. Basilar artery: Normal. Vertebral arteries: Left dominant. Normal. Superior cerebellar arteries: Normal. Anterior inferior cerebellar arteries: Poorly visualized. Posterior inferior cerebellar arteries: Normal. MRA NECK FINDINGS There is diminished flow related enhancement within both proximal internal carotid arteries with approximately 75% stenosis. Both vertebral arteries are normal caliber along their entire visualized length. There is a 3 vessel aortic arch branching pattern. IMPRESSION: 1. Numerous foci of acute ischemia within the left hemisphere along the left ACA-MCA and MCA-PCA watershed zones. The pattern is most consistent with a hypotensive/hypoperfusion infarct. 2. No hemorrhage or mass effect. 3. No emergent large vessel occlusion. 4. 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 Confirmation with CT angiography of the neck or carotid ultrasound is recommended, as this study is prone to artifacts. Electronically Signed   By: Deatra Robinson M.D.   On: 07/18/2017 21:22   Mr Maxine Glenn Neck Wo Contrast  Result Date: 07/18/2017 CLINICAL DATA:  Right arm weakness EXAM: MRI HEAD WITHOUT CONTRAST MRA HEAD WITHOUT CONTRAST MRA NECK WITHOUT CONTRAST TECHNIQUE: Multiplanar, multiecho pulse sequences of the brain and surrounding structures were obtained without intravenous contrast. Angiographic images of the Circle of Willis were obtained using MRA technique without intravenous contrast. Angiographic images of the neck were obtained using MRA technique without intravenous contrast. Carotid stenosis measurements (when applicable)  are obtained utilizing NASCET criteria, using the distal internal carotid diameter as the denominator. COMPARISON:  Head CT 07/18/2017 FINDINGS: MRI HEAD FINDINGS Brain: The midline structures are normal. There is multifocal hyperintensity on diffusion-weighted imaging in the left hemisphere, along the  margins between the anterior and middle cerebral arteries and margin between the middle and posterior cerebral arteries. ADC map shows varying degrees of corresponding diffusion restriction. There is corresponding hyperintense T2-weighted signal. Otherwise, the brain parenchyma is normal. No mass lesion. No chronic microhemorrhage or cerebral amyloid angiopathy. No hydrocephalus, age advanced atrophy or lobar predominant volume loss. No dural abnormality or extra-axial collection. Skull and upper cervical spine: The visualized skull base, calvarium, upper cervical spine and extracranial soft tissues are normal. Sinuses/Orbits: No fluid levels or advanced mucosal thickening. No mastoid effusion. Normal orbits. MRA HEAD FINDINGS Intracranial internal carotid arteries: Normal. Anterior cerebral arteries: Normal. Middle cerebral arteries: Normal. Posterior communicating arteries: Present on the right. Posterior cerebral arteries: Normal. Basilar artery: Normal. Vertebral arteries: Left dominant. Normal. Superior cerebellar arteries: Normal. Anterior inferior cerebellar arteries: Poorly visualized. Posterior inferior cerebellar arteries: Normal. MRA NECK FINDINGS There is diminished flow related enhancement within both proximal internal carotid arteries with approximately 75% stenosis. Both vertebral arteries are normal caliber along their entire visualized length. There is a 3 vessel aortic arch branching pattern. IMPRESSION: 1. Numerous foci of acute ischemia within the left hemisphere along the left ACA-MCA and MCA-PCA watershed zones. The pattern is most consistent with a hypotensive/hypoperfusion infarct. 2. No hemorrhage or mass effect. 3. No emergent large vessel occlusion. 4. 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 Confirmation with CT angiography of the neck or carotid ultrasound is recommended, as this study is prone to artifacts. Electronically  Signed   By: Deatra RobinsonKevin  Herman M.D.   On: 07/18/2017 21:22   Mr Brain Wo Contrast  Result Date: 07/18/2017 CLINICAL DATA:  Right arm weakness EXAM: MRI HEAD WITHOUT CONTRAST MRA HEAD WITHOUT CONTRAST MRA NECK WITHOUT CONTRAST TECHNIQUE: Multiplanar, multiecho pulse sequences of the brain and surrounding structures were obtained without intravenous contrast. Angiographic images of the Circle of Willis were obtained using MRA technique without intravenous contrast. Angiographic images of the neck were obtained using MRA technique without intravenous contrast. Carotid stenosis measurements (when applicable) are obtained utilizing NASCET criteria, using the distal internal carotid diameter as the denominator. COMPARISON:  Head CT 07/18/2017 FINDINGS: MRI HEAD FINDINGS Brain: The midline structures are normal. There is multifocal hyperintensity on diffusion-weighted imaging in the left hemisphere, along the margins between the anterior and middle cerebral arteries and margin between the middle and posterior cerebral arteries. ADC map shows varying degrees of corresponding diffusion restriction. There is corresponding hyperintense T2-weighted signal. Otherwise, the brain parenchyma is normal. No mass lesion. No chronic microhemorrhage or cerebral amyloid angiopathy. No hydrocephalus, age advanced atrophy or lobar predominant volume loss. No dural abnormality or extra-axial collection. Skull and upper cervical spine: The visualized skull base, calvarium, upper cervical spine and extracranial soft tissues are normal. Sinuses/Orbits: No fluid levels or advanced mucosal thickening. No mastoid effusion. Normal orbits. MRA HEAD FINDINGS Intracranial internal carotid arteries: Normal. Anterior cerebral arteries: Normal. Middle cerebral arteries: Normal. Posterior communicating arteries: Present on the right. Posterior cerebral arteries: Normal. Basilar artery: Normal. Vertebral arteries: Left dominant. Normal. Superior  cerebellar arteries: Normal. Anterior inferior cerebellar arteries: Poorly visualized. Posterior inferior cerebellar arteries: Normal. MRA NECK FINDINGS There is diminished flow related enhancement within both proximal internal carotid arteries with  approximately 75% stenosis. Both vertebral arteries are normal caliber along their entire visualized length. There is a 3 vessel aortic arch branching pattern. IMPRESSION: 1. Numerous foci of acute ischemia within the left hemisphere along the left ACA-MCA and MCA-PCA watershed zones. The pattern is most consistent with a hypotensive/hypoperfusion infarct. 2. No hemorrhage or mass effect. 3. No emergent large vessel occlusion. 4. 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 Confirmation with CT angiography of the neck or carotid ultrasound is recommended, as this study is prone to artifacts. Electronically Signed   By: Deatra RobinsonKevin  Herman M.D.   On: 07/18/2017 21:22    Assessment: 66 y.o. male presenting with RUE weakness.  MRI of the brain reviewed and shows multiple small left watershed infarcts.  Likey embolic in etiology.  Patient with a history of Factor V Leiden on no anticoagulation or antiplatelet therapy at home.  MRA shows 75% stenosis of the carotids bilaterally.  Echocardiogram pending.  A1c 5.2.  LDL 157.  Further work up recommended.    Stroke Risk Factors - hypercoagulable state and hyperlipidemia  Plan: 1. PT consult, OT consult, Speech consult 2. Echocardiogram, bubble study 3. Prophylactic therapy-ASA 81mg  daily and Plavix 75mg  daily.  Will make determination as to need for anticoagulation after work up complete.   4. BLE dopplers 5. Telemetry monitoring 6. Frequent neuro checks 7. Vascular consult 8. Statin for lipid management with target LDL<70.   Thana FarrLeslie Malijah Lietz, MD Neurology [phone] 07/19/2017, 2:44 PM

## 2017-07-19 NOTE — Progress Notes (Signed)
PT Cancellation Note  Patient Details Name: Bryan BirchLarry A Klasen Sr. MRN: [id number] DOB: 07-09-1951   Cancelled Treatment:    Reason Eval/Treat Not Completed: Other (comment).  Pt feels he is getting around fine but RUE is extremely weak.  Consult placed for OT and will ck tomorrow to see if he is still fine and sign off orders.   Ivar DrapeRuth E Soundra Lampley 07/19/2017, 11:37 AM   Samul Dadauth Mercie Balsley, PT MS Acute Rehab Dept. Number: [facility] [id number] and [facility] [phone]

## 2017-07-19 NOTE — Consult Note (Signed)
[facility] [place]  Patient name: Bryan MCEACHIN Sr. MRN: [id number] DOB: [birth year] Sex: male   REQUESTING PROVIDER:    Dr. Renae Gloss   REASON FOR CONSULT:    stroke  HISTORY OF PRESENT ILLNESS:   Bryan YONKER Sr. is a 66 y.o. male, who presented to the emergency department yesterday with right arm weakness.  He reports having several episodes over the past several weeks, however on the day of presentation, he could not use his right hand.  He denies any other symptoms of right leg weakness, slurred speech or amaurosis fugax.  The patient does have a history of factor V Leiden mutation.  He has what sounds like a superficial thrombophlebitis in the past for which he was anticoagulated for.  He suffers from hypercholesterolemia.  There is a family history for early cardiac disease in his sister and father.  His father had a heart attack in his 67s as did his sister.  He is a non-smoker.  He denies any history of cardiac disease.  He denies any chest pain or shortness of breath.  PAST MEDICAL HISTORY    Past Medical History:  Diagnosis Date  . Diverticulosis   . Factor V Leiden (HCC)   . Hyperlipidemia   . Plantar fasciitis      FAMILY HISTORY   Family History  Problem Relation Age of Onset  . CAD Mother   . Diabetes Mother   . CAD Father   . CAD Sister     SOCIAL HISTORY:   Social History   Socioeconomic History  . Marital status: Married    Spouse name: Not on file  . Number of children: Not on file  . Years of education: Not on file  . Highest education level: Not on file  Social Needs  . Financial resource strain: Not on file  . Food insecurity - worry: Not on file  . Food insecurity - inability: Not on file  . Transportation needs - medical: Not on file  . Transportation needs - non-medical: Not on file  Occupational History  . Not on file  Tobacco Use  . Smoking status: Never Smoker  .  Smokeless tobacco: Former Engineer, water and Sexual Activity  . Alcohol use: Not on file  . Drug use: No  . Sexual activity: Not on file  Other Topics Concern  . Not on file  Social History Narrative  . Not on file    ALLERGIES:    No Known Allergies  CURRENT MEDICATIONS:    Current Facility-Administered Medications  Medication Dose Route Frequency Provider Last Rate Last Dose  . 0.9 %  sodium chloride infusion   Intravenous Continuous Alford Highland, MD 50 mL/hr at 07/19/17 1558    . acetaminophen (TYLENOL) tablet 650 mg  650 mg Oral Q4H PRN Oralia Manis, MD       Or  . acetaminophen (TYLENOL) suppository 650 mg  650 mg Rectal Q4H PRN Oralia Manis, MD      . apixaban Everlene Balls) tablet 5 mg  5 mg Oral BID Alford Highland, MD      . aspirin EC tablet 81 mg  81 mg Oral Daily Thana Farr, MD   81 mg at 07/19/17 1451  . [START ON 07/20/2017] atorvastatin (LIPITOR) tablet 40 mg  40 mg Oral Daily Wieting, Richard, MD      . sodium chloride flush (NS) 0.9 % injection 3 mL  3 mL Intravenous Q12H Alford Highland, MD  3 mL at 07/19/17 0951    REVIEW OF SYSTEMS:   [X]  denotes positive finding, [ ]  denotes negative finding Cardiac  Comments:  Chest pain or chest pressure:    Shortness of breath upon exertion:    Short of breath when lying flat:    Irregular heart rhythm:        Vascular    Pain in calf, thigh, or hip brought on by ambulation:    Pain in feet at night that wakes you up from your sleep:     Blood clot in your veins:    Leg swelling:         Pulmonary    Oxygen at home:    Productive cough:     Wheezing:         Neurologic    Sudden weakness in arms or legs:  x   Sudden numbness in arms or legs:     Sudden onset of difficulty speaking or slurred speech:    Temporary loss of vision in one eye:     Problems with dizziness:         Gastrointestinal    Blood in stool:      Vomited blood:         Genitourinary    Burning when urinating:       Blood in urine:        Psychiatric    Major depression:         Hematologic    Bleeding problems:    Problems with blood clotting too easily:        Skin    Rashes or ulcers:        Constitutional    Fever or chills:     PHYSICAL EXAM:   Vitals:   07/19/17 0750 07/19/17 0954 07/19/17 1215 07/19/17 1603  BP: (!) 154/88 (!) 151/89 (!) 148/92 129/90  Pulse: 77 84 87 79  Resp: 20 20 20 20   Temp: 97.7 F (36.5 C) 98.2 F (36.8 C) 98 F (36.7 C) 98 F (36.7 C)  TempSrc: Oral Oral Oral Oral  SpO2: 93% 95% 98% 97%  Weight:      Height:        GENERAL: The patient is a well-nourished male, in no acute distress. The vital signs are documented above. CARDIAC: There is a regular rate and rhythm.  PULMONARY: Nonlabored respirations ABDOMEN: Soft and non-tender with normal pitched bowel sounds.  MUSCULOSKELETAL: There are no major deformities or cyanosis. NEUROLOGIC: Loss of motor function to the right arm SKIN: There are no ulcers or rashes noted. PSYCHIATRIC: The patient has a normal affect.  STUDIES:   I have reviewed the MRI of the head and neck with the following findings  1. Numerous foci of acute ischemia within the left hemisphere along the left ACA-MCA and MCA-PCA watershed zones. The pattern is most consistent with a hypotensive/hypoperfusion infarct. 2. No hemorrhage or mass effect. 3. No emergent large vessel occlusion. 4. 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 Confirmation with CT angiography of the neck or carotid ultrasound is recommended, as this study is prone to artifacts.  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  A CT angiogram of the head and neck is recommended and has been ordered for tomorrow.  I discussed with the patient that given the  severity  of his right arm paralysis, I would like for him to continue with rehabilitation for 1-3 weeks prior to any surgical intervention.  Aspirin and Plavix are recommended as well as high-dose, statin therapy.  I will follow-up tomorrow after his CT angiogram has been performed.   Durene CalWells Brabham, MD [facility] Tel [phone] Pager [phone]

## 2017-07-19 NOTE — Progress Notes (Signed)
Pt holds right arm up for NIH test, but then has to ":drop" it because it gets "too heavy."; no grip. No there deficits noted. OT consult requested and done. Neurochecks/vs's stable. Wife in.

## 2017-07-19 NOTE — Progress Notes (Signed)
Patient ID: Bryan BirchLarry A Okubo Sr., male   DOB: 1951-06-29, 66 y.o.   MRN: [id number]  [facility] PROGRESS NOTE  Bryan BirchLarry A Kawai Sr. MVH:[id number] DOB: 1951-06-29 DOA: 07/18/2017 PCP: Danella PentonMiller, Mark F, MD  HPI/Subjective: Patient came in with inability to move his right arm very well for the past week.  Found to have stroke and watershed area.  Objective: Vitals:   07/19/17 0954 07/19/17 1215  BP: (!) 151/89 (!) 148/92  Pulse: 84 87  Resp: 20 20  Temp: 98.2 F (36.8 C) 98 F (36.7 C)  SpO2: 95% 98%    Filed Weights   07/18/17 1636  Weight: 88 kg (194 lb)    ROS: Review of Systems  Constitutional: Negative for chills and fever.  Eyes: Negative for blurred vision.  Respiratory: Negative for cough and shortness of breath.   Cardiovascular: Negative for chest pain.  Gastrointestinal: Negative for abdominal pain, constipation, diarrhea, nausea and vomiting.  Genitourinary: Negative for dysuria.  Musculoskeletal: Negative for joint pain.  Neurological: Positive for focal weakness. Negative for dizziness and headaches.   Exam: Physical Exam  Constitutional: He is oriented to person, place, and time.  HENT:  Nose: No mucosal edema.  Mouth/Throat: No oropharyngeal exudate or posterior oropharyngeal edema.  Eyes: Conjunctivae, EOM and lids are normal. Pupils are equal, round, and reactive to light.  Neck: No JVD present. Carotid bruit is not present. No edema present. No thyroid mass and no thyromegaly present.  Cardiovascular: S1 normal and S2 normal. Exam reveals no gallop.  No murmur heard. Pulses:      Dorsalis pedis pulses are 2+ on the right side, and 2+ on the left side.  Respiratory: No respiratory distress. He has no wheezes. He has no rhonchi. He has no rales.  GI: Soft. Bowel sounds are normal. There is no tenderness.  Musculoskeletal:       Right ankle: He exhibits no swelling.       Left ankle: He exhibits no swelling.  Lymphadenopathy:    He has no  cervical adenopathy.  Neurological: He is alert and oriented to person, place, and time. No cranial nerve deficit.  Right arm weakness.  Able to flex a little bit at the shoulder and elbow.  Slightly able to extend at the right wrist.  Unable to squeeze with his hand.  Skin: Skin is warm. No rash noted. Nails show no clubbing.  Psychiatric: He has a normal mood and affect.      Data Reviewed: Basic Metabolic Panel: Recent Labs  Lab 07/18/17 1644  NA 136  K 4.0  CL 105  CO2 20*  GLUCOSE 106*  BUN 16  CREATININE 1.15  CALCIUM 9.4   Liver Function Tests: Recent Labs  Lab 07/18/17 1644  AST 29  ALT 33  ALKPHOS 84  BILITOT 0.4  PROT 7.6  ALBUMIN 4.4   CBC: Recent Labs  Lab 07/18/17 1644  WBC 8.0  NEUTROABS 5.1  HGB 17.4  HCT 51.3  MCV 94.4  PLT 197   Cardiac Enzymes: Recent Labs  Lab 07/18/17 1644  TROPONINI <0.03    CBG: Recent Labs  Lab 07/18/17 1647  GLUCAP 102*      Studies: Ct Head Wo Contrast  Result Date: 07/18/2017 CLINICAL DATA:  Progressive right arm weakness for 2 weeks. EXAM: CT HEAD WITHOUT CONTRAST TECHNIQUE: Contiguous axial images were obtained from the base of the skull through the vertex without intravenous contrast. COMPARISON:  07/03/2011 FINDINGS: Brain: There is no evidence of  acute large territory infarct, intracranial hemorrhage, mass, midline shift, or extra-axial fluid collection. The ventricles and sulci are normal. Scattered small foci of cerebral white matter hypodensity are new from the prior study and most notable in the subcortical left parietal lobe and left centrum semiovale. Vascular: No hyperdense vessel. Skull: No fracture or focal osseous lesion. Sinuses/Orbits: Visualized paranasal sinuses and mastoid air cells are clear. Visualized orbits are unremarkable. Other: None. IMPRESSION: 1. No evidence of acute cortically based infarct or intracranial hemorrhage. 2. New small foci of cerebral white matter hypodensity most  notable in the left frontoparietal region, nonspecific though may reflect age-indeterminate small vessel ischemia. Electronically Signed   By: Sebastian Ache M.D.   On: 07/18/2017 17:08   Mr Maxine Glenn Head Wo Contrast  Result Date: 07/18/2017 CLINICAL DATA:  Right arm weakness EXAM: MRI HEAD WITHOUT CONTRAST MRA HEAD WITHOUT CONTRAST MRA NECK WITHOUT CONTRAST TECHNIQUE: Multiplanar, multiecho pulse sequences of the brain and surrounding structures were obtained without intravenous contrast. Angiographic images of the Circle of Willis were obtained using MRA technique without intravenous contrast. Angiographic images of the neck were obtained using MRA technique without intravenous contrast. Carotid stenosis measurements (when applicable) are obtained utilizing NASCET criteria, using the distal internal carotid diameter as the denominator. COMPARISON:  Head CT 07/18/2017 FINDINGS: MRI HEAD FINDINGS Brain: The midline structures are normal. There is multifocal hyperintensity on diffusion-weighted imaging in the left hemisphere, along the margins between the anterior and middle cerebral arteries and margin between the middle and posterior cerebral arteries. ADC map shows varying degrees of corresponding diffusion restriction. There is corresponding hyperintense T2-weighted signal. Otherwise, the brain parenchyma is normal. No mass lesion. No chronic microhemorrhage or cerebral amyloid angiopathy. No hydrocephalus, age advanced atrophy or lobar predominant volume loss. No dural abnormality or extra-axial collection. Skull and upper cervical spine: The visualized skull base, calvarium, upper cervical spine and extracranial soft tissues are normal. Sinuses/Orbits: No fluid levels or advanced mucosal thickening. No mastoid effusion. Normal orbits. MRA HEAD FINDINGS Intracranial internal carotid arteries: Normal. Anterior cerebral arteries: Normal. Middle cerebral arteries: Normal. Posterior communicating arteries: Present on  the right. Posterior cerebral arteries: Normal. Basilar artery: Normal. Vertebral arteries: Left dominant. Normal. Superior cerebellar arteries: Normal. Anterior inferior cerebellar arteries: Poorly visualized. Posterior inferior cerebellar arteries: Normal. MRA NECK FINDINGS There is diminished flow related enhancement within both proximal internal carotid arteries with approximately 75% stenosis. Both vertebral arteries are normal caliber along their entire visualized length. There is a 3 vessel aortic arch branching pattern. IMPRESSION: 1. Numerous foci of acute ischemia within the left hemisphere along the left ACA-MCA and MCA-PCA watershed zones. The pattern is most consistent with a hypotensive/hypoperfusion infarct. 2. No hemorrhage or mass effect. 3. No emergent large vessel occlusion. 4. 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 Confirmation with CT angiography of the neck or carotid ultrasound is recommended, as this study is prone to artifacts. Electronically Signed   By: Deatra Robinson M.D.   On: 07/18/2017 21:22   Mr Maxine Glenn Neck Wo Contrast  Result Date: 07/18/2017 CLINICAL DATA:  Right arm weakness EXAM: MRI HEAD WITHOUT CONTRAST MRA HEAD WITHOUT CONTRAST MRA NECK WITHOUT CONTRAST TECHNIQUE: Multiplanar, multiecho pulse sequences of the brain and surrounding structures were obtained without intravenous contrast. Angiographic images of the Circle of Willis were obtained using MRA technique without intravenous contrast. Angiographic images of the neck were obtained using MRA technique without intravenous contrast. Carotid stenosis measurements (when applicable) are obtained  utilizing NASCET criteria, using the distal internal carotid diameter as the denominator. COMPARISON:  Head CT 07/18/2017 FINDINGS: MRI HEAD FINDINGS Brain: The midline structures are normal. There is multifocal hyperintensity on diffusion-weighted imaging in the left  hemisphere, along the margins between the anterior and middle cerebral arteries and margin between the middle and posterior cerebral arteries. ADC map shows varying degrees of corresponding diffusion restriction. There is corresponding hyperintense T2-weighted signal. Otherwise, the brain parenchyma is normal. No mass lesion. No chronic microhemorrhage or cerebral amyloid angiopathy. No hydrocephalus, age advanced atrophy or lobar predominant volume loss. No dural abnormality or extra-axial collection. Skull and upper cervical spine: The visualized skull base, calvarium, upper cervical spine and extracranial soft tissues are normal. Sinuses/Orbits: No fluid levels or advanced mucosal thickening. No mastoid effusion. Normal orbits. MRA HEAD FINDINGS Intracranial internal carotid arteries: Normal. Anterior cerebral arteries: Normal. Middle cerebral arteries: Normal. Posterior communicating arteries: Present on the right. Posterior cerebral arteries: Normal. Basilar artery: Normal. Vertebral arteries: Left dominant. Normal. Superior cerebellar arteries: Normal. Anterior inferior cerebellar arteries: Poorly visualized. Posterior inferior cerebellar arteries: Normal. MRA NECK FINDINGS There is diminished flow related enhancement within both proximal internal carotid arteries with approximately 75% stenosis. Both vertebral arteries are normal caliber along their entire visualized length. There is a 3 vessel aortic arch branching pattern. IMPRESSION: 1. Numerous foci of acute ischemia within the left hemisphere along the left ACA-MCA and MCA-PCA watershed zones. The pattern is most consistent with a hypotensive/hypoperfusion infarct. 2. No hemorrhage or mass effect. 3. No emergent large vessel occlusion. 4. 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 Confirmation with CT angiography of the neck or carotid ultrasound is recommended, as this study is prone to  artifacts. Electronically Signed   By: Deatra RobinsonKevin  Herman M.D.   On: 07/18/2017 21:22   Mr Brain Wo Contrast  Result Date: 07/18/2017 CLINICAL DATA:  Right arm weakness EXAM: MRI HEAD WITHOUT CONTRAST MRA HEAD WITHOUT CONTRAST MRA NECK WITHOUT CONTRAST TECHNIQUE: Multiplanar, multiecho pulse sequences of the brain and surrounding structures were obtained without intravenous contrast. Angiographic images of the Circle of Willis were obtained using MRA technique without intravenous contrast. Angiographic images of the neck were obtained using MRA technique without intravenous contrast. Carotid stenosis measurements (when applicable) are obtained utilizing NASCET criteria, using the distal internal carotid diameter as the denominator. COMPARISON:  Head CT 07/18/2017 FINDINGS: MRI HEAD FINDINGS Brain: The midline structures are normal. There is multifocal hyperintensity on diffusion-weighted imaging in the left hemisphere, along the margins between the anterior and middle cerebral arteries and margin between the middle and posterior cerebral arteries. ADC map shows varying degrees of corresponding diffusion restriction. There is corresponding hyperintense T2-weighted signal. Otherwise, the brain parenchyma is normal. No mass lesion. No chronic microhemorrhage or cerebral amyloid angiopathy. No hydrocephalus, age advanced atrophy or lobar predominant volume loss. No dural abnormality or extra-axial collection. Skull and upper cervical spine: The visualized skull base, calvarium, upper cervical spine and extracranial soft tissues are normal. Sinuses/Orbits: No fluid levels or advanced mucosal thickening. No mastoid effusion. Normal orbits. MRA HEAD FINDINGS Intracranial internal carotid arteries: Normal. Anterior cerebral arteries: Normal. Middle cerebral arteries: Normal. Posterior communicating arteries: Present on the right. Posterior cerebral arteries: Normal. Basilar artery: Normal. Vertebral arteries: Left dominant.  Normal. Superior cerebellar arteries: Normal. Anterior inferior cerebellar arteries: Poorly visualized. Posterior inferior cerebellar arteries: Normal. MRA NECK FINDINGS There is diminished flow related enhancement within both proximal internal carotid arteries with approximately 75%  stenosis. Both vertebral arteries are normal caliber along their entire visualized length. There is a 3 vessel aortic arch branching pattern. IMPRESSION: 1. Numerous foci of acute ischemia within the left hemisphere along the left ACA-MCA and MCA-PCA watershed zones. The pattern is most consistent with a hypotensive/hypoperfusion infarct. 2. No hemorrhage or mass effect. 3. No emergent large vessel occlusion. 4. 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 Confirmation with CT angiography of the neck or carotid ultrasound is recommended, as this study is prone to artifacts. Electronically Signed   By: Deatra Robinson M.D.   On: 07/18/2017 21:22    Scheduled Meds: . aspirin EC  81 mg Oral Daily  . [START ON 07/20/2017] atorvastatin  40 mg Oral Daily  . clopidogrel  75 mg Oral Daily  . enoxaparin (LOVENOX) injection  40 mg Subcutaneous Q24H  . sodium chloride flush  3 mL Intravenous Q12H   Continuous Infusions: . sodium chloride      Assessment/Plan:  1. 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  Case discussed with neurology.  Aspirin and Plavix prescribed for now.  Start atorvastatin high-dose.  Physical therapy and Occupational Therapy consultations.  Echocardiogram with bubble study must be done prior to disposition. 2. Bilateral carotid stenosis on MRA.  CT angios ordered for tomorrow morning. 3. Hyperlipidemia unspecified.  LDL 157 goal less than 70 4. Hypertriglyceridemia.  Lipitor should help this a little bit too. 5. History of factor V Leiden and DVT.  Ultrasound of the lower extremities.  Likely the patient will need  anticoagulation but will see if vascular wants to do any surgical procedure first.  Code Status:     Code Status Orders  (From admission, onward)        Start     Ordered   07/19/17 0006  Full code  Continuous     07/19/17 0005    Code Status History    Date Active Date Inactive Code Status Order ID Comments User Context   This patient has a current code status but no historical code status.     Family Communication: Wife at bedside Disposition Plan: To be determined  Consultants:  Neurology  Vascular surgery  Time spent: 28 minutes, case discussed with neurology  Alford Highland  [facility]

## 2017-07-20 ENCOUNTER — Inpatient Hospital Stay: Payer: Medicare Other

## 2017-07-20 LAB — BASIC METABOLIC PANEL
Anion gap: 6 (ref 5–15)
BUN: 15 mg/dL (ref 6–20)
CALCIUM: 8.7 mg/dL — AB (ref 8.9–10.3)
CHLORIDE: 107 mmol/L (ref 101–111)
CO2: 25 mmol/L (ref 22–32)
CREATININE: 0.99 mg/dL (ref 0.61–1.24)
GFR calc non Af Amer: >60 mL/min (ref >60)
Glucose, Bld: 100 mg/dL — ABNORMAL HIGH (ref 65–99)
Potassium: 4.1 mmol/L (ref 3.5–5.1)
Sodium: 138 mmol/L (ref 135–145)

## 2017-07-20 MED ORDER — IOPAMIDOL (ISOVUE-370) INJECTION 76%
75.0000 mL | Freq: Once | INTRAVENOUS | Status: AC | PRN
  Administered 2017-07-20: 75 mL via INTRAVENOUS

## 2017-07-20 NOTE — Plan of Care (Signed)
Alert and oriented pt. No complaints of pain or discomfort. Up in room independently. Still with weakness to the right arm, but improved.

## 2017-07-20 NOTE — Plan of Care (Signed)
CT carotid show severe bilateral stenosis with pt and wife aware. MD advised pt/wife of Left leg DVT and need for long term anticoagulant therapy. BP slightly more elevated with IVF's discontinued after hydration for CT scan done. Neurochecks stable with slight improvement in sensation right hand, pt able to flex elbow, lift arm better, can make slight right hand grip. Up in room and tolerated well. Awaiting vascular input and ECHO bubble study. Continue eliquis at this time.

## 2017-07-20 NOTE — Progress Notes (Signed)
Patient ID: Bryan Birch Sr., male   DOB: [birth year], 66 y.o.   MRN: [id number]  [facility] PROGRESS NOTE  Bryan VOORHIS Sr. [name] DOB: Aug 05, 1950 DOA: 07/18/2017 PCP: Bryan Penton, MD  HPI/Subjective: Patient able to move his right arm a little bit better today.  Able to flex at the shoulder and at the elbow and able to squeeze a little bit with his fingers today.  Objective: Vitals:   07/20/17 0849 07/20/17 1204  BP: (!) 157/103 (!) 159/103  Pulse: 80 98  Resp: 20 20  Temp: 97.8 F (36.6 C) 98.1 F (36.7 C)  SpO2: 97% 98%    Filed Weights   07/18/17 1636  Weight: 88 kg (194 lb)    ROS: Review of Systems  Constitutional: Negative for chills and fever.  Eyes: Negative for blurred vision.  Respiratory: Negative for cough and shortness of breath.   Cardiovascular: Negative for chest pain.  Gastrointestinal: Negative for abdominal pain, constipation, diarrhea, nausea and vomiting.  Genitourinary: Negative for dysuria.  Musculoskeletal: Negative for joint pain.  Neurological: Positive for focal weakness. Negative for dizziness and headaches.   Exam: Physical Exam  Constitutional: He is oriented to person, place, and time.  HENT:  Nose: No mucosal edema.  Mouth/Throat: No oropharyngeal exudate or posterior oropharyngeal edema.  Eyes: Conjunctivae, EOM and lids are normal. Pupils are equal, round, and reactive to light.  Neck: No JVD present. Carotid bruit is not present. No edema present. No thyroid mass and no thyromegaly present.  Cardiovascular: S1 normal and S2 normal. Exam reveals no gallop.  No murmur heard. Pulses:      Dorsalis pedis pulses are 2+ on the right side, and 2+ on the left side.  Respiratory: No respiratory distress. He has no wheezes. He has no rhonchi. He has no rales.  GI: Soft. Bowel sounds are normal. There is no tenderness.  Musculoskeletal:       Right ankle: He exhibits no swelling.       Left ankle: He exhibits no  swelling.  Lymphadenopathy:    He has no cervical adenopathy.  Neurological: He is alert and oriented to person, place, and time. No cranial nerve deficit.  Patient still with right arm weakness but able to move his right arm better today than yesterday.  Able to flex a little bit more with his shoulder and right elbow.  Patient able to do a grip strength today.  Skin: Skin is warm. No rash noted. Nails show no clubbing.  Psychiatric: He has a normal mood and affect.      Data Reviewed: Basic Metabolic Panel: Recent Labs  Lab 07/18/17 1644 07/20/17 0440  NA 136 138  K 4.0 4.1  CL 105 107  CO2 20* 25  GLUCOSE 106* 100*  BUN 16 15  CREATININE 1.15 0.99  CALCIUM 9.4 8.7*   Liver Function Tests: Recent Labs  Lab 07/18/17 1644  AST 29  ALT 33  ALKPHOS 84  BILITOT 0.4  PROT 7.6  ALBUMIN 4.4   CBC: Recent Labs  Lab 07/18/17 1644  WBC 8.0  NEUTROABS 5.1  HGB 17.4  HCT 51.3  MCV 94.4  PLT 197   Cardiac Enzymes: Recent Labs  Lab 07/18/17 1644  TROPONINI <0.03    CBG: Recent Labs  Lab 07/18/17 1647  GLUCAP 102*      Studies: Ct Head Wo Contrast  Result Date: 07/18/2017 CLINICAL DATA:  Progressive right arm weakness for 2 weeks. EXAM: CT HEAD WITHOUT  CONTRAST TECHNIQUE: Contiguous axial images were obtained from the base of the skull through the vertex without intravenous contrast. COMPARISON:  07/03/2011 FINDINGS: Brain: There is no evidence of acute large territory infarct, intracranial hemorrhage, mass, midline shift, or extra-axial fluid collection. The ventricles and sulci are normal. Scattered small foci of cerebral white matter hypodensity are new from the prior study and most notable in the subcortical left parietal lobe and left centrum semiovale. Vascular: No hyperdense vessel. Skull: No fracture or focal osseous lesion. Sinuses/Orbits: Visualized paranasal sinuses and mastoid air cells are clear. Visualized orbits are unremarkable. Other: None.  IMPRESSION: 1. No evidence of acute cortically based infarct or intracranial hemorrhage. 2. New small foci of cerebral white matter hypodensity most notable in the left frontoparietal region, nonspecific though may reflect age-indeterminate small vessel ischemia. Electronically Signed   By: Sebastian Ache M.D.   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 Factor V Leiden deficiency. EXAM: CT ANGIOGRAPHY NECK TECHNIQUE: Multidetector CT imaging of the neck was performed using the standard protocol during bolus administration of intravenous contrast. Multiplanar CT image reconstructions and MIPs were obtained to evaluate the vascular anatomy. Carotid stenosis measurements (when applicable) are obtained utilizing NASCET criteria, using the distal internal carotid diameter as the denominator. CONTRAST:  75mL ISOVUE-370 IOPAMIDOL (ISOVUE-370) INJECTION 76% COMPARISON:  MRI brain and MRA head and neck in 07/18/2017. FINDINGS: Aortic arch: The a 3 vessel arch configuration is present. There is minimal calcification along the underside of the arch. No significant stenosis or aneurysm is present. Right carotid system: The right common carotid artery is within normal limits. A high-grade, near occlusive, stenosis is present at the origin of the right internal carotid artery. The lumen is not measurable. The more distal right ICA is within normal limits. Focal calcification is present in the petrous segment, just above the skullbase. There is no significant stenosis at that level. Left carotid system: The left common carotid artery is within normal limits. Dense calcifications and soft tissue plaque are present at the bifurcation. There is a high-grade, near occlusive stenosis of the left internal carotid artery with extensive soft plaque extending superiorly over 10 mm. No significant distal  stenosis is present. The distal right internal carotid artery is slightly more narrow than on the left. Additional calcifications are present within the petrous segment without a significant tandem stenosis. Vertebral arteries: The vertebral artery is originate from the subclavian artery is without significant stenosis. Right vertebral artery is slightly dominant to the right. There is no significant stenosis in the neck. Vertebrobasilar junction is normal. PICA origins are visualized and normal bilaterally. Skeleton: Dextroconvex curvature is present in the cervical spine with asymmetric uncovertebral disease on the left at C3-4 and C4-5 in particular. Vertebral body heights are maintained. 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 Other neck: The soft tissues of the neck are otherwise unremarkable. The thyroid is within normal limits. There is no significant cervical adenopathy. Upper chest: The lung apices are clear. IMPRESSION: 1. Severe, near occlusive stenosis of the proximal internal carotid artery is bilaterally. 2. Soft tissue or fibrotic plaque more extensive left than right. This may represent acute soft plaque within the lumen on the left distal to the high-grade stenosis. 3. Slight decreased caliber of the more distal left internal carotid artery compared to the right suggests a greater hemodynamic effect on the left, corresponding to the site  of the patient's recent watershed type infarctions. These results were called by telephone at the time of interpretation on 07/20/2017 at 11:53 am to Drs. Rhema Boyett and Durene Cal , who verbally acknowledged these results. Electronically Signed   By: Marin Roberts M.D.   On: 07/20/2017 12:10   Mr Maxine Glenn Head Wo Contrast  Result Date: 07/18/2017 CLINICAL DATA:  Right arm weakness EXAM: MRI HEAD WITHOUT CONTRAST MRA HEAD WITHOUT CONTRAST MRA NECK WITHOUT CONTRAST TECHNIQUE: Multiplanar, multiecho pulse sequences of the brain and surrounding structures  were obtained without intravenous contrast. Angiographic images of the Circle of Willis were obtained using MRA technique without intravenous contrast. Angiographic images of the neck were obtained using MRA technique without intravenous contrast. Carotid stenosis measurements (when applicable) are obtained utilizing NASCET criteria, using the distal internal carotid diameter as the denominator. COMPARISON:  Head CT 07/18/2017 FINDINGS: MRI HEAD FINDINGS Brain: The midline structures are normal. There is multifocal hyperintensity on diffusion-weighted imaging in the left hemisphere, along the margins between the anterior and middle cerebral arteries and margin between the middle and posterior cerebral arteries. ADC map shows varying degrees of corresponding diffusion restriction. There is corresponding hyperintense T2-weighted signal. Otherwise, the brain parenchyma is normal. No mass lesion. No chronic microhemorrhage or cerebral amyloid angiopathy. No hydrocephalus, age advanced atrophy or lobar predominant volume loss. No dural abnormality or extra-axial collection. Skull and upper cervical spine: The visualized skull base, calvarium, upper cervical spine and extracranial soft tissues are normal. Sinuses/Orbits: No fluid levels or advanced mucosal thickening. No mastoid effusion. Normal orbits. MRA HEAD FINDINGS Intracranial internal carotid arteries: Normal. Anterior cerebral arteries: Normal. Middle cerebral arteries: Normal. Posterior communicating arteries: Present on the right. Posterior cerebral arteries: Normal. Basilar artery: Normal. Vertebral arteries: Left dominant. Normal. Superior cerebellar arteries: Normal. Anterior inferior cerebellar arteries: Poorly visualized. Posterior inferior cerebellar arteries: Normal. MRA NECK FINDINGS There is diminished flow related enhancement within both proximal internal carotid arteries with approximately 75% stenosis. Both vertebral arteries are normal caliber  along their entire visualized length. There is a 3 vessel aortic arch branching pattern. IMPRESSION: 1. Numerous foci of acute ischemia within the left hemisphere along the left ACA-MCA and MCA-PCA watershed zones. The pattern is most consistent with a hypotensive/hypoperfusion infarct. 2. No hemorrhage or mass effect. 3. No emergent large vessel occlusion. 4. 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 Confirmation with CT angiography of the neck or carotid ultrasound is recommended, as this study is prone to artifacts. Electronically Signed   By: Deatra Robinson M.D.   On: 07/18/2017 21:22   Mr Maxine Glenn Neck Wo Contrast  Result Date: 07/18/2017 CLINICAL DATA:  Right arm weakness EXAM: MRI HEAD WITHOUT CONTRAST MRA HEAD WITHOUT CONTRAST MRA NECK WITHOUT CONTRAST TECHNIQUE: Multiplanar, multiecho pulse sequences of the brain and surrounding structures were obtained without intravenous contrast. Angiographic images of the Circle of Willis were obtained using MRA technique without intravenous contrast. Angiographic images of the neck were obtained using MRA technique without intravenous contrast. Carotid stenosis measurements (when applicable) are obtained utilizing NASCET criteria, using the distal internal carotid diameter as the denominator. COMPARISON:  Head CT 07/18/2017 FINDINGS: MRI HEAD FINDINGS Brain: The midline structures are normal. There is multifocal hyperintensity on diffusion-weighted imaging in the left hemisphere, along the margins between the anterior and middle cerebral arteries and margin between the middle and posterior cerebral arteries. ADC map shows varying degrees of corresponding diffusion restriction. There is corresponding hyperintense T2-weighted signal. Otherwise,  the brain parenchyma is normal. No mass lesion. No chronic microhemorrhage or cerebral amyloid angiopathy. No hydrocephalus, age advanced atrophy or lobar predominant  volume loss. No dural abnormality or extra-axial collection. Skull and upper cervical spine: The visualized skull base, calvarium, upper cervical spine and extracranial soft tissues are normal. Sinuses/Orbits: No fluid levels or advanced mucosal thickening. No mastoid effusion. Normal orbits. MRA HEAD FINDINGS Intracranial internal carotid arteries: Normal. Anterior cerebral arteries: Normal. Middle cerebral arteries: Normal. Posterior communicating arteries: Present on the right. Posterior cerebral arteries: Normal. Basilar artery: Normal. Vertebral arteries: Left dominant. Normal. Superior cerebellar arteries: Normal. Anterior inferior cerebellar arteries: Poorly visualized. Posterior inferior cerebellar arteries: Normal. MRA NECK FINDINGS There is diminished flow related enhancement within both proximal internal carotid arteries with approximately 75% stenosis. Both vertebral arteries are normal caliber along their entire visualized length. There is a 3 vessel aortic arch branching pattern. IMPRESSION: 1. Numerous foci of acute ischemia within the left hemisphere along the left ACA-MCA and MCA-PCA watershed zones. The pattern is most consistent with a hypotensive/hypoperfusion infarct. 2. No hemorrhage or mass effect. 3. No emergent large vessel occlusion. 4. 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 Confirmation with CT angiography of the neck or carotid ultrasound is recommended, as this study is prone to artifacts. Electronically Signed   By: Deatra RobinsonKevin  Herman M.D.   On: 07/18/2017 21:22   Mr Brain Wo Contrast  Result Date: 07/18/2017 CLINICAL DATA:  Right arm weakness EXAM: MRI HEAD WITHOUT CONTRAST MRA HEAD WITHOUT CONTRAST MRA NECK WITHOUT CONTRAST TECHNIQUE: Multiplanar, multiecho pulse sequences of the brain and surrounding structures were obtained without intravenous contrast. Angiographic images of the Circle of Willis were obtained using  MRA technique without intravenous contrast. Angiographic images of the neck were obtained using MRA technique without intravenous contrast. Carotid stenosis measurements (when applicable) are obtained utilizing NASCET criteria, using the distal internal carotid diameter as the denominator. COMPARISON:  Head CT 07/18/2017 FINDINGS: MRI HEAD FINDINGS Brain: The midline structures are normal. There is multifocal hyperintensity on diffusion-weighted imaging in the left hemisphere, along the margins between the anterior and middle cerebral arteries and margin between the middle and posterior cerebral arteries. ADC map shows varying degrees of corresponding diffusion restriction. There is corresponding hyperintense T2-weighted signal. Otherwise, the brain parenchyma is normal. No mass lesion. No chronic microhemorrhage or cerebral amyloid angiopathy. No hydrocephalus, age advanced atrophy or lobar predominant volume loss. No dural abnormality or extra-axial collection. Skull and upper cervical spine: The visualized skull base, calvarium, upper cervical spine and extracranial soft tissues are normal. Sinuses/Orbits: No fluid levels or advanced mucosal thickening. No mastoid effusion. Normal orbits. MRA HEAD FINDINGS Intracranial internal carotid arteries: Normal. Anterior cerebral arteries: Normal. Middle cerebral arteries: Normal. Posterior communicating arteries: Present on the right. Posterior cerebral arteries: Normal. Basilar artery: Normal. Vertebral arteries: Left dominant. Normal. Superior cerebellar arteries: Normal. Anterior inferior cerebellar arteries: Poorly visualized. Posterior inferior cerebellar arteries: Normal. MRA NECK FINDINGS There is diminished flow related enhancement within both proximal internal carotid arteries with approximately 75% stenosis. Both vertebral arteries are normal caliber along their entire visualized length. There is a 3 vessel aortic arch branching pattern. IMPRESSION: 1. Numerous  foci of acute ischemia within the left hemisphere along the left ACA-MCA and MCA-PCA watershed zones. The pattern is most consistent with a hypotensive/hypoperfusion infarct. 2. No hemorrhage or mass effect. 3. No emergent large vessel occlusion. 4. Greater than 75% stenosis of both proximal internal carotid arteries demonstrated  on time-of-flight MRA. Confirmation with CT angiography of the neck or carotid ultrasound is recommended, as this study is prone to artifacts. Electronically Signed   By: Deatra Robinson M.D.   On: 07/18/2017 21:22   US Venous Img Lower Bilateral  Result Date: 07/19/2017 CLINICAL DATA:  66 year old male with a history of swelling EXAM: BILATERAL LOWER EXTREMITY VENOUS DOPPLER ULTRASOUND TECHNIQUE: Gray-scale sonography with graded compression, as well as color Doppler and duplex ultrasound were performed to evaluate the lower extremity deep venous systems from the level of the common femoral vein and including the common femoral, femoral, profunda femoral, popliteal and calf veins including the posterior tibial, peroneal and gastrocnemius veins when visible. The superficial great saphenous vein was also interrogated. Spectral Doppler was utilized to evaluate flow at rest and with distal augmentation maneuvers in the common femoral, femoral and popliteal veins. COMPARISON:  None. FINDINGS: RIGHT LOWER EXTREMITY Common Femoral Vein: No evidence of thrombus. Normal compressibility, respiratory phasicity and response to augmentation. Saphenofemoral Junction: No evidence of thrombus. Normal compressibility and flow on color Doppler imaging. Profunda Femoral Vein: No evidence of thrombus. Normal compressibility and flow on color Doppler imaging. Femoral Vein: No evidence of thrombus. Normal compressibility, respiratory phasicity and response to augmentation. Popliteal Vein: No evidence of thrombus. Normal compressibility, respiratory phasicity and response to augmentation. Calf Veins: No  evidence of thrombus. Normal compressibility and flow on color Doppler imaging. Superficial Great Saphenous Vein: No evidence of thrombus. Normal compressibility and flow on color Doppler imaging. Venous Reflux:  None. Other Findings:  None. LEFT LOWER EXTREMITY Occlusive thrombus involves the common femoral vein and femoral vein extending to the popliteal vein. Popliteal vein is patent. There is a paired femoral vein which remains patent in the thigh. Popliteal vein remains patent with compressible vein and flow maintained. Posterior tibial veins and peroneal veins are patent with no thrombus. Superficial Great Saphenous Vein: No evidence of thrombus. Normal compressibility and flow on color Doppler imaging. Other Findings:  None. IMPRESSION: Sonographic survey of the left lower extremity positive for acute DVT of the common femoral vein and 1 of the paired femoral veins. Sonographic survey of the right lower extremity negative for acute DVT. There is a paired left-sided femoral vein which remains patent. These results were called by by the completing technologist at the time of interpretation on 07/19/2017 at 4:23 pm to Dr. Alford Highland. Signed, Yvone Neu. Loreta Ave, DO [facility] Electronically Signed   By: Gilmer Mor D.O.   On: 07/19/2017 16:24    Scheduled Meds: . apixaban  5 mg Oral BID  . atorvastatin  40 mg Oral Daily  . sodium chloride flush  3 mL Intravenous Q12H   Assessment/Plan:  1. 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  Case discussed with neurology.  Patient switched to Eliquis.  Start atorvastatin high-dose.   Echocardiogram with bubble study must be done prior to disposition. 2. Bilateral carotid stenosis on MRA and CT angio.  Case discussed with vascular surgery they still would like to wait a few weeks prior to any procedure.  Patient would have to be off Eliquis at that time and potentially back  on aspirin and Plavix. 3. DVT left lower extremity.  Eliquis prescribed 5 mg twice daily as per neurology.  Patient will need lifelong anticoagulation with his history of factor V Leiden. 4. Hyperlipidemia unspecified.  LDL 157 goal less than 70.  Lipitor 40 mg prescribed 5. Hypertriglyceridemia.  Lipitor should help this a little bit too. 6. History of factor V Leiden and DVT.  Patient will need lifelong anticoagulation.  Code Status:     Code Status Orders  (From admission, onward)        Start     Ordered   07/19/17 0006  Full code  Continuous     07/19/17 0005    Code Status History    Date Active Date Inactive Code Status Order ID Comments User Context   This patient has a current code status but no historical code status.     Family Communication: Wife at bedside Disposition Plan: Potentially home tomorrow after echocardiogram with bubble study  Consultants:  Neurology  Vascular surgery  Time spent: 28 minutes, case discussed with vascular surgery  Makynlee Kressin [employer]  [employer]

## 2017-07-20 NOTE — Progress Notes (Signed)
Subjective: Patient with some movement noted in the right hand today.  No new neurological complaints.    Objective: Current vital signs: BP (!) 157/103 (BP Location: Left Arm)   Pulse 80   Temp 97.8 F (36.6 C) (Oral)   Resp 20   Ht 5\' 5"  (1.651 m)   Wt 88 kg (194 lb)   SpO2 97%   BMI 32.28 kg/m  Vital signs in last 24 hours: Temp:  [97.6 F (36.4 C)-98.5 F (36.9 C)] 97.8 F (36.6 C) (12/30 0849) Pulse Rate:  [76-94] 80 (12/30 0849) Resp:  [16-20] 20 (12/30 0849) BP: (129-162)/(85-103) 157/103 (12/30 0849) SpO2:  [93 %-98 %] 97 % (12/30 0849)  Intake/Output from previous day: 12/29 0701 - 12/30 0700 In: 1408.5 [P.O.:840; I.V.:568.5] Out: -  Intake/Output this shift: Total I/O In: 1082 [P.O.:240; I.V.:842] Out: -  Nutritional status: Diet Heart Room service appropriate? Yes; Fluid consistency: Thin  Neurologic Exam: Mental Status: Alert, oriented, thought content appropriate.  Speech fluent without evidence of aphasia.  Able to follow 3 step commands without difficulty. Cranial Nerves: II: Discs flat bilaterally; Visual fields grossly normal, pupils equal, round, reactive to light and accommodation III,IV, VI: ptosis not present, extra-ocular motions intact bilaterally V,VII: smile symmetric, facial light touch sensation normal bilaterally VIII: hearing normal bilaterally IX,X: gag reflex present XI: bilateral shoulder shrug XII: midline tongue extension Motor: Right :  Upper extremity   3+/5 proximally, 1-2/5 distally                          Left:     Upper extremity   5/5             Lower extremity   5/5                                                                                 Lower extremity   5/5   Lab Results: Basic Metabolic Panel: Recent Labs  Lab 07/18/17 1644 07/20/17 0440  NA 136 138  K 4.0 4.1  CL 105 107  CO2 20* 25  GLUCOSE 106* 100*  BUN 16 15  CREATININE 1.15 0.99  CALCIUM 9.4 8.7*    Liver Function Tests: Recent Labs  Lab  07/18/17 1644  AST 29  ALT 33  ALKPHOS 84  BILITOT 0.4  PROT 7.6  ALBUMIN 4.4   No results for input(s): LIPASE, AMYLASE in the last 168 hours. No results for input(s): AMMONIA in the last 168 hours.  CBC: Recent Labs  Lab 07/18/17 1644  WBC 8.0  NEUTROABS 5.1  HGB 17.4  HCT 51.3  MCV 94.4  PLT 197    Cardiac Enzymes: Recent Labs  Lab 07/18/17 1644  TROPONINI <0.03    Lipid Panel: Recent Labs  Lab 07/19/17 0328  CHOL 260*  TRIG 290*  HDL 45  CHOLHDL 5.8  VLDL 58*  LDLCALC 157*    CBG: Recent Labs  Lab 07/18/17 1647  GLUCAP 102*    Microbiology: No results found for this or any previous visit.  Coagulation Studies: Recent Labs    07/18/17 1644  LABPROT 11.3*  INR 0.83  Imaging: Ct Head Wo Contrast  Result Date: 07/18/2017 CLINICAL DATA:  Progressive right arm weakness for 2 weeks. EXAM: CT HEAD WITHOUT CONTRAST TECHNIQUE: Contiguous axial images were obtained from the base of the skull through the vertex without intravenous contrast. COMPARISON:  07/03/2011 FINDINGS: Brain: There is no evidence of acute large territory infarct, intracranial hemorrhage, mass, midline shift, or extra-axial fluid collection. The ventricles and sulci are normal. Scattered small foci of cerebral white matter hypodensity are new from the prior study and most notable in the subcortical left parietal lobe and left centrum semiovale. Vascular: No hyperdense vessel. Skull: No fracture or focal osseous lesion. Sinuses/Orbits: Visualized paranasal sinuses and mastoid air cells are clear. Visualized orbits are unremarkable. Other: None. IMPRESSION: 1. No evidence of acute cortically based infarct or intracranial hemorrhage. 2. New small foci of cerebral white matter hypodensity most notable in the left frontoparietal region, nonspecific though may reflect age-indeterminate small vessel ischemia. Electronically Signed   By: Sebastian Ache M.D.   On: 07/18/2017 17:08   Mr Maxine Glenn Head  Wo Contrast  Result Date: 07/18/2017 CLINICAL DATA:  Right arm weakness EXAM: MRI HEAD WITHOUT CONTRAST MRA HEAD WITHOUT CONTRAST MRA NECK WITHOUT CONTRAST TECHNIQUE: Multiplanar, multiecho pulse sequences of the brain and surrounding structures were obtained without intravenous contrast. Angiographic images of the Circle of Willis were obtained using MRA technique without intravenous contrast. Angiographic images of the neck were obtained using MRA technique without intravenous contrast. Carotid stenosis measurements (when applicable) are obtained utilizing NASCET criteria, using the distal internal carotid diameter as the denominator. COMPARISON:  Head CT 07/18/2017 FINDINGS: MRI HEAD FINDINGS Brain: The midline structures are normal. There is multifocal hyperintensity on diffusion-weighted imaging in the left hemisphere, along the margins between the anterior and middle cerebral arteries and margin between the middle and posterior cerebral arteries. ADC map shows varying degrees of corresponding diffusion restriction. There is corresponding hyperintense T2-weighted signal. Otherwise, the brain parenchyma is normal. No mass lesion. No chronic microhemorrhage or cerebral amyloid angiopathy. No hydrocephalus, age advanced atrophy or lobar predominant volume loss. No dural abnormality or extra-axial collection. Skull and upper cervical spine: The visualized skull base, calvarium, upper cervical spine and extracranial soft tissues are normal. Sinuses/Orbits: No fluid levels or advanced mucosal thickening. No mastoid effusion. Normal orbits. MRA HEAD FINDINGS Intracranial internal carotid arteries: Normal. Anterior cerebral arteries: Normal. Middle cerebral arteries: Normal. Posterior communicating arteries: Present on the right. Posterior cerebral arteries: Normal. Basilar artery: Normal. Vertebral arteries: Left dominant. Normal. Superior cerebellar arteries: Normal. Anterior inferior cerebellar arteries: Poorly  visualized. Posterior inferior cerebellar arteries: Normal. MRA NECK FINDINGS There is diminished flow related enhancement within both proximal internal carotid arteries with approximately 75% stenosis. Both vertebral arteries are normal caliber along their entire visualized length. There is a 3 vessel aortic arch branching pattern. IMPRESSION: 1. Numerous foci of acute ischemia within the left hemisphere along the left ACA-MCA and MCA-PCA watershed zones. The pattern is most consistent with a hypotensive/hypoperfusion infarct. 2. No hemorrhage or mass effect. 3. No emergent large vessel occlusion. 4. 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 Confirmation with CT angiography of the neck or carotid ultrasound is recommended, as this study is prone to artifacts. Electronically Signed   By: Deatra Robinson M.D.   On: 07/18/2017 21:22   Mr Maxine Glenn Neck Wo Contrast  Result Date: 07/18/2017 CLINICAL DATA:  Right arm weakness EXAM: MRI HEAD WITHOUT CONTRAST MRA HEAD WITHOUT CONTRAST MRA NECK WITHOUT  CONTRAST TECHNIQUE: Multiplanar, multiecho pulse sequences of the brain and surrounding structures were obtained without intravenous contrast. Angiographic images of the Circle of Willis were obtained using MRA technique without intravenous contrast. Angiographic images of the neck were obtained using MRA technique without intravenous contrast. Carotid stenosis measurements (when applicable) are obtained utilizing NASCET criteria, using the distal internal carotid diameter as the denominator. COMPARISON:  Head CT 07/18/2017 FINDINGS: MRI HEAD FINDINGS Brain: The midline structures are normal. There is multifocal hyperintensity on diffusion-weighted imaging in the left hemisphere, along the margins between the anterior and middle cerebral arteries and margin between the middle and posterior cerebral arteries. ADC map shows varying degrees of corresponding diffusion  restriction. There is corresponding hyperintense T2-weighted signal. Otherwise, the brain parenchyma is normal. No mass lesion. No chronic microhemorrhage or cerebral amyloid angiopathy. No hydrocephalus, age advanced atrophy or lobar predominant volume loss. No dural abnormality or extra-axial collection. Skull and upper cervical spine: The visualized skull base, calvarium, upper cervical spine and extracranial soft tissues are normal. Sinuses/Orbits: No fluid levels or advanced mucosal thickening. No mastoid effusion. Normal orbits. MRA HEAD FINDINGS Intracranial internal carotid arteries: Normal. Anterior cerebral arteries: Normal. Middle cerebral arteries: Normal. Posterior communicating arteries: Present on the right. Posterior cerebral arteries: Normal. Basilar artery: Normal. Vertebral arteries: Left dominant. Normal. Superior cerebellar arteries: Normal. Anterior inferior cerebellar arteries: Poorly visualized. Posterior inferior cerebellar arteries: Normal. MRA NECK FINDINGS There is diminished flow related enhancement within both proximal internal carotid arteries with approximately 75% stenosis. Both vertebral arteries are normal caliber along their entire visualized length. There is a 3 vessel aortic arch branching pattern. IMPRESSION: 1. Numerous foci of acute ischemia within the left hemisphere along the left ACA-MCA and MCA-PCA watershed zones. The pattern is most consistent with a hypotensive/hypoperfusion infarct. 2. No hemorrhage or mass effect. 3. No emergent large vessel occlusion. 4. 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 Confirmation with CT angiography of the neck or carotid ultrasound is recommended, as this study is prone to artifacts. Electronically Signed   By: Deatra RobinsonKevin  Herman M.D.   On: 07/18/2017 21:22   Mr Brain Wo Contrast  Result Date: 07/18/2017 CLINICAL DATA:  Right arm weakness EXAM: MRI HEAD WITHOUT CONTRAST MRA  HEAD WITHOUT CONTRAST MRA NECK WITHOUT CONTRAST TECHNIQUE: Multiplanar, multiecho pulse sequences of the brain and surrounding structures were obtained without intravenous contrast. Angiographic images of the Circle of Willis were obtained using MRA technique without intravenous contrast. Angiographic images of the neck were obtained using MRA technique without intravenous contrast. Carotid stenosis measurements (when applicable) are obtained utilizing NASCET criteria, using the distal internal carotid diameter as the denominator. COMPARISON:  Head CT 07/18/2017 FINDINGS: MRI HEAD FINDINGS Brain: The midline structures are normal. There is multifocal hyperintensity on diffusion-weighted imaging in the left hemisphere, along the margins between the anterior and middle cerebral arteries and margin between the middle and posterior cerebral arteries. ADC map shows varying degrees of corresponding diffusion restriction. There is corresponding hyperintense T2-weighted signal. Otherwise, the brain parenchyma is normal. No mass lesion. No chronic microhemorrhage or cerebral amyloid angiopathy. No hydrocephalus, age advanced atrophy or lobar predominant volume loss. No dural abnormality or extra-axial collection. Skull and upper cervical spine: The visualized skull base, calvarium, upper cervical spine and extracranial soft tissues are normal. Sinuses/Orbits: No fluid levels or advanced mucosal thickening. No mastoid effusion. Normal orbits. MRA HEAD FINDINGS Intracranial internal carotid arteries: Normal. Anterior cerebral arteries: Normal. Middle cerebral arteries: Normal. Posterior  communicating arteries: Present on the right. Posterior cerebral arteries: Normal. Basilar artery: Normal. Vertebral arteries: Left dominant. Normal. Superior cerebellar arteries: Normal. Anterior inferior cerebellar arteries: Poorly visualized. Posterior inferior cerebellar arteries: Normal. MRA NECK FINDINGS There is diminished flow related  enhancement within both proximal internal carotid arteries with approximately 75% stenosis. Both vertebral arteries are normal caliber along their entire visualized length. There is a 3 vessel aortic arch branching pattern. IMPRESSION: 1. Numerous foci of acute ischemia within the left hemisphere along the left ACA-MCA and MCA-PCA watershed zones. The pattern is most consistent with a hypotensive/hypoperfusion infarct. 2. No hemorrhage or mass effect. 3. No emergent large vessel occlusion. 4. 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 Confirmation with CT angiography of the neck or carotid ultrasound is recommended, as this study is prone to artifacts. Electronically Signed   By: Deatra Robinson M.D.   On: 07/18/2017 21:22   US Venous Img Lower Bilateral  Result Date: 07/19/2017 CLINICAL DATA:  66 year old male with a history of swelling EXAM: BILATERAL LOWER EXTREMITY VENOUS DOPPLER ULTRASOUND TECHNIQUE: Gray-scale sonography with graded compression, as well as color Doppler and duplex ultrasound were performed to evaluate the lower extremity deep venous systems from the level of the common femoral vein and including the common femoral, femoral, profunda femoral, popliteal and calf veins including the posterior tibial, peroneal and gastrocnemius veins when visible. The superficial great saphenous vein was also interrogated. Spectral Doppler was utilized to evaluate flow at rest and with distal augmentation maneuvers in the common femoral, femoral and popliteal veins. COMPARISON:  None. FINDINGS: RIGHT LOWER EXTREMITY Common Femoral Vein: No evidence of thrombus. Normal compressibility, respiratory phasicity and response to augmentation. Saphenofemoral Junction: No evidence of thrombus. Normal compressibility and flow on color Doppler imaging. Profunda Femoral Vein: No evidence of thrombus. Normal compressibility and flow on color Doppler imaging.  Femoral Vein: No evidence of thrombus. Normal compressibility, respiratory phasicity and response to augmentation. Popliteal Vein: No evidence of thrombus. Normal compressibility, respiratory phasicity and response to augmentation. Calf Veins: No evidence of thrombus. Normal compressibility and flow on color Doppler imaging. Superficial Great Saphenous Vein: No evidence of thrombus. Normal compressibility and flow on color Doppler imaging. Venous Reflux:  None. Other Findings:  None. LEFT LOWER EXTREMITY Occlusive thrombus involves the common femoral vein and femoral vein extending to the popliteal vein. Popliteal vein is patent. There is a paired femoral vein which remains patent in the thigh. Popliteal vein remains patent with compressible vein and flow maintained. Posterior tibial veins and peroneal veins are patent with no thrombus. Superficial Great Saphenous Vein: No evidence of thrombus. Normal compressibility and flow on color Doppler imaging. Other Findings:  None. IMPRESSION: Sonographic survey of the left lower extremity positive for acute DVT of the common femoral vein and 1 of the paired femoral veins. Sonographic survey of the right lower extremity negative for acute DVT. There is a paired left-sided femoral vein which remains patent. These results were called by by the completing technologist at the time of interpretation on 07/19/2017 at 4:23 pm to Dr. Alford Highland. Signed, Yvone Neu. Loreta Ave, DO [facility] Electronically Signed   By: Gilmer Mor D.O.   On: 07/19/2017 16:24    Medications:  I have reviewed the patient's current medications. Scheduled: . apixaban  5 mg Oral BID  . atorvastatin  40 mg Oral Daily  . sodium chloride flush  3 mL Intravenous Q12H  Assessment/Plan: No new neurological complaints.  Lower extremity dopplers reveal a LLE common femoral vein DVT.  Eliquis initiated without high dose start.  ASA and  Plavix discontinued.  Patient on a statin.  CTA of the neck pending to further evaluate carotids.  Echocardiogram pending as well.    Will continue to follow with you   LOS: 2 days   Thana Farr, MD Neurology [phone] 07/20/2017  11:15 AM

## 2017-07-20 NOTE — Progress Notes (Signed)
PT Cancellation Note  Patient Details Name: Bryan BirchLarry A Kopko Sr. MRN: [id number] DOB: 12/03/1950   Cancelled Treatment:    Reason Eval/Treat Not Completed: Other (comment).  Reports he is still fine to walk and declines therapy so will dc order for now.  OT is scheduled to assess RUE weakness which is last symptom.   Ivar DrapeRuth E Rafay Dahan 07/20/2017, 9:41 AM   Samul Dadauth Youssouf Shipley, PT MS Acute Rehab Dept. Number: [facility] [phone] and [facility] [id number]

## 2017-07-20 NOTE — Progress Notes (Signed)
    Subjective  -  Has better movement of his right hand today No new neurologic deficits   Physical Exam:  Patient is able to raise his right arm and has some grip strength back.  Fine motor skills are still lacking. Abdomen is soft and nontender Respirations are nonlabored  I have reviewed the following studies CT angiogram: 1. Severe, near occlusive stenosis of the proximal internal carotid artery is bilaterally. 2. Soft tissue or fibrotic plaque more extensive left than right. This may represent acute soft plaque within the lumen on the left distal to the high-grade stenosis. 3. Slight decreased caliber of the more distal left internal carotid artery compared to the right suggests a greater hemodynamic effect on the left, corresponding to the site of the patient's recent watershed type infarctions.  Venous duplex: Sonographic survey of the left lower extremity positive for acute DVT of the common femoral vein and 1 of the paired femoral veins.  Sonographic survey of the right lower extremity negative for acute DVT.  There is a paired left-sided femoral vein which remains patent.      Assessment/Plan:   Symptomatic left carotid stenosis: The patient has had some improvement in his right arm deficits.  His CT angiogram shows a high-grade left carotid stenosis.  Recommend physical therapy and optimal medical management at this time.  We discussed proceeding with left carotid endarterectomy in approximately 2 weeks.  I will schedule him for a follow-up office visit in 1 week to further discuss the operation  Acute DVT: The patient has been started on Eliquis at a reduced dose, given his recent stroke.  He is also on aspirin.  He has factor V Leiden mutation and therefore should remain on anticoagulation for life.  This will need to be temporarily discontinued in the perioperative period.  Consideration for removal IVC filter prior to his operation.  Wells  Searra Carnathan 07/20/2017 7:27 PM --  Vitals:   07/20/17 1204 07/20/17 1602  BP: (!) 159/103 (!) 155/80  Pulse: 98 80  Resp: 20 18  Temp: 98.1 F (36.7 C) 97.7 F (36.5 C)  SpO2: 98% 93%    Intake/Output Summary (Last 24 hours) at 07/20/2017 1927 Last data filed at 07/20/2017 1902 Gross per 24 hour  Intake 2404.5 ml  Output -  Net 2404.5 ml     Laboratory CBC    Component Value Date/Time   WBC 8.0 07/18/2017 1644   HGB 17.4 07/18/2017 1644   HCT 51.3 07/18/2017 1644   PLT 197 07/18/2017 1644    BMET    Component Value Date/Time   NA 138 07/20/2017 0440   K 4.1 07/20/2017 0440   CL 107 07/20/2017 0440   CO2 25 07/20/2017 0440   GLUCOSE 100 (H) 07/20/2017 0440   BUN 15 07/20/2017 0440   CREATININE 0.99 07/20/2017 0440   CALCIUM 8.7 (L) 07/20/2017 0440   GFRNONAA >60 07/20/2017 0440   GFRAA >60 07/20/2017 0440    COAG Lab Results  Component Value Date   INR 0.83 07/18/2017   No results found for: PTT  Antibiotics Anti-infectives (From admission, onward)   None       V. Charlena CrossWells Angelamarie Avakian IV, M.D. [facility] [place] Office: [phone] Pager:  [phone]

## 2017-07-21 ENCOUNTER — Inpatient Hospital Stay
Admit: 2017-07-21 | Discharge: 2017-07-21 | Disposition: A | Discharge status: Home / Self Care | Payer: Medicare Other | Attending: Neurology | Admitting: Neurology

## 2017-07-21 MED ORDER — ASPIRIN EC 81 MG PO TBEC
81.0000 mg | DELAYED_RELEASE_TABLET | Freq: Every day | ORAL | Status: DC
  Administered 2017-07-21: 14:00:00 81 mg via ORAL
  Filled 2017-07-21: qty 1

## 2017-07-21 MED ORDER — ATORVASTATIN CALCIUM 40 MG PO TABS: 40.0000 mg | ORAL_TABLET | Freq: Every day | ORAL | 0 refills | Status: DC

## 2017-07-21 MED ORDER — ASPIRIN 81 MG PO TBEC: 81.0000 mg | DELAYED_RELEASE_TABLET | Freq: Every day | ORAL | 0 refills | Status: DC

## 2017-07-21 MED ORDER — APIXABAN 5 MG PO TABS
5.0000 mg | ORAL_TABLET | Freq: Two times a day (BID) | ORAL | 0 refills | Status: DC
Start: 2017-07-21 — End: 2017-08-24

## 2017-07-21 MED ORDER — METOPROLOL SUCCINATE ER 25 MG PO TB24: 12.5000 mg | ORAL_TABLET | Freq: Every day | ORAL | 0 refills | Status: DC

## 2017-07-21 NOTE — Discharge Instructions (Signed)
Information on my medicine - ELIQUIS (apixaban)  This medication education was reviewed with me or my healthcare representative as part of my discharge preparation.  The pharmacist that spoke with me during my hospital stay was:  Olene FlossMelissa D Lowell Mcgurk, [facility]  Why was Eliquis prescribed for you? Eliquis was prescribed to treat blood clots that may have been found in the veins of your legs (deep vein thrombosis) or in your lungs (pulmonary embolism) and to reduce the risk of them occurring again.  What do You need to know about Eliquis ? Take ONE 5 mg tablet taken TWICE daily.  Eliquis may be taken with or without food.   Try to take the dose about the same time in the morning and in the evening. If you have difficulty swallowing the tablet whole please discuss with your pharmacist how to take the medication safely.  Take Eliquis exactly as prescribed and DO NOT stop taking Eliquis without talking to the doctor who prescribed the medication.  Stopping may increase your risk of developing a new blood clot.  Refill your prescription before you run out.  After discharge, you should have regular check-up appointments with your healthcare provider that is prescribing your Eliquis.    What do you do if you miss a dose? If a dose of ELIQUIS is not taken at the scheduled time, take it as soon as possible on the same day and twice-daily administration should be resumed. The dose should not be doubled to make up for a missed dose.  Important Safety Information A possible side effect of Eliquis is bleeding. You should call your healthcare provider right away if you experience any of the following: ? Bleeding from an injury or your nose that does not stop. ? Unusual colored urine (red or dark brown) or unusual colored stools (red or black). ? Unusual bruising for unknown reasons. ? A serious fall or if you hit your head (even if there is no bleeding).  Some medicines may interact with Eliquis and  might increase your risk of bleeding or clotting while on Eliquis. To help avoid this, consult your healthcare provider or pharmacist prior to using any new prescription or non-prescription medications, including herbals, vitamins, non-steroidal anti-inflammatory drugs (NSAIDs) and supplements.  This website has more information on Eliquis (apixaban): http://www.eliquis.com/eliquis/home

## 2017-07-21 NOTE — Progress Notes (Signed)
Pt for discharge home/ dr Juliann Parescallwood saw on consult  Earlier. Sl x2 d/cd. Pt ready for discharge per dr Renae Glosswieting. Instructions given  To pt and wife.  presc given and discussed. Diet activity and f/u discussed.  Verbalizes understanding.

## 2017-07-21 NOTE — Discharge Summary (Signed)
[facility] [employer] - [place] at [facility]   PATIENT NAME: Bryan Holland    MR#:  [id number]  DATE OF BIRTH:  March 24, 1951  DATE OF ADMISSION:  07/18/2017 ADMITTING PHYSICIAN: Oralia Manisavid Willis, MD  DATE OF DISCHARGE: 07/21/2017  PRIMARY CARE PHYSICIAN: Danella PentonMiller, Mark F, MD    ADMISSION DIAGNOSIS:  Cerebrovascular accident (CVA), unspecified mechanism (HCC) [I63.9]  DISCHARGE DIAGNOSIS:  Principal Problem:   Stroke [facility](HCC) Active Problems:   Carotid artery stenosis   Stroke (cerebrum) (HCC)   SECONDARY DIAGNOSIS:   Past Medical History:  Diagnosis Date  . Diverticulosis   . Factor V Leiden (HCC)   . Hyperlipidemia   . Plantar fasciitis     HOSPITAL COURSE:   1.  Acute CVA watershed area of the brain on the left causing right arm weakness.  Likely source is carotid stenosis.  Since the patient also has a DVT they were placed on Eliquis for anticoagulation.  The patient was placed on aspirin also.  Echocardiogram showed a normal ejection fraction and no source of cardiac emboli.  CT angiogram of the carotids showed critical stenosis bilaterally.  Vascular surgery recommended carotid endarterectomy in a few weeks.  Patient would have to be off Eliquis for 2 days prior to procedure.  Telemetry monitoring showed normal sinus rhythm.  Outpatient occupational therapy evaluation. 2.  Bilateral carotid stenosis on MRA of the neck and CT Angio of the neck.  Vascular surgery would like to wait a few weeks prior to carotid endarterectomy.  Dr. Myra GianottiBrabham vascular surgery recommended aspirin with the Eliquis.  Follow-up with Dr. Gilda CreaseSchnier vascular surgery on January seventh at 1:45 PM. 3.  DVT left lower extremity with history of factor V Leiden.  Eliquis 5 mg twice a day prescribed as per neurology.  Neurology did not want high dose DVT dosing at this point secondary to stroke.  The patient will need lifelong anticoagulation with his history of factor V Leiden and repeat DVT. 4.  Hyperlipidemia  unspecified.  LDL 157 and goal less than 70.  Lipitor 40 mg prescribed. 5.  Hypertriglyceridemia.  Lipitor should help this also. 6.  History of factor V Leiden DVT.  Patient will need lifelong anticoagulation. 7.  2.91-second pause.  Cardiology believes this was secondary to carotid disease.  He recommended Toprol XL 12.5 mg daily for cardioprotection prior to carotid endarterectomy.  No further cardiac testing needed prior to procedure. 8.  Consider outpatient sleep study to rule out sleep apnea.  This can be done after carotid endarterectomy. 9.  Essential hypertension.  With his carotid stenosis and stroke I do not mind his blood pressure high at this point.  Cardiology recommended low-dose Toprol prior to carotid endarterectomy.  DISCHARGE CONDITIONS:   Satisfactory  CONSULTS OBTAINED:  Treatment Team:  Thana Farreynolds, Leslie, MD Nada LibmanBrabham, Vance W, MD Dorothyann Pengallwood, Dwayne D, MD  DRUG ALLERGIES:  No Known Allergies  DISCHARGE MEDICATIONS:   Allergies as of 07/21/2017   No Known Allergies     Medication List    TAKE these medications   apixaban 5 MG Tabs tablet Commonly known as:  ELIQUIS Take 1 tablet (5 mg total) by mouth 2 (two) times daily.   aspirin 81 MG EC tablet Take 1 tablet (81 mg total) by mouth daily.   atorvastatin 40 MG tablet Commonly known as:  LIPITOR Take 1 tablet (40 mg total) by mouth daily. Start taking on:  07/22/2017 What changed:    medication strength  how much to take   metoprolol  succinate 25 MG 24 hr tablet Commonly known as:  TOPROL XL Take 0.5 tablets (12.5 mg total) by mouth daily.        DISCHARGE INSTRUCTIONS:   Follow-up with Dr. Gilda Crease vascular surgery Follow-up PMD 1 week Can follow-up with Dr [facility] cardiology after procedure.  If you experience worsening of your admission symptoms, develop shortness of breath, life threatening emergency, suicidal or homicidal thoughts you must seek medical attention immediately by calling 911  or calling your MD immediately  if symptoms less severe.  You Must read complete instructions/literature along with all the possible adverse reactions/side effects for all the Medicines you take and that have been prescribed to you. Take any new Medicines after you have completely understood and accept all the possible adverse reactions/side effects.   Please note  You were cared for by a hospitalist during your hospital stay. If you have any questions about your discharge medications or the care you received while you were in the hospital after you are discharged, you can call the unit and asked to speak with the hospitalist on call if the hospitalist that took care of you is not available. Once you are discharged, your primary care physician will handle any further medical issues. Please note that NO REFILLS for any discharge medications will be authorized once you are discharged, as it is imperative that you return to your primary care physician (or establish a relationship with a primary care physician if you do not have one) for your aftercare needs so that they can reassess your need for medications and monitor your lab values.    Today   CHIEF COMPLAINT:   Chief Complaint  Patient presents with  . Extremity Weakness    HISTORY OF PRESENT ILLNESS:  Bryan Holland  is a 66 y.o. male presented with right arm weakness   VITAL SIGNS:  Blood pressure (!) 160/99, pulse 75, temperature 97.7 F (36.5 C), temperature source Oral, resp. rate 20, height 5\' 5"  (1.651 m), weight 88 kg (194 lb), SpO2 97 %.    PHYSICAL EXAMINATION:  GENERAL:  66 y.o.-year-old patient lying in the bed with no acute distress.  EYES: Pupils equal, round, reactive to light and accommodation. No scleral icterus. Extraocular muscles intact.  HEENT: Head atraumatic, normocephalic. Oropharynx and nasopharynx clear.  NECK:  Supple, no jugular venous distention. No thyroid enlargement, no tenderness.  LUNGS: Normal  breath sounds bilaterally, no wheezing, rales,rhonchi or crepitation. No use of accessory muscles of respiration.  CARDIOVASCULAR: S1, S2 normal. No murmurs, rubs, or gallops.  ABDOMEN: Soft, non-tender, non-distended. Bowel sounds present. No organomegaly or mass.  EXTREMITIES: No pedal edema, cyanosis, or clubbing.  NEUROLOGIC: Cranial nerves II through XII are intact.  Right arm weakness and incoordination.  Patient now able to flex at shoulder and elbow and squeeze a little bit with his right hand.  Gait normal PSYCHIATRIC: The patient is alert and oriented x 3.  SKIN: No obvious rash, lesion, or ulcer.   DATA REVIEW:   CBC Recent Labs  Lab 07/18/17 1644  WBC 8.0  HGB 17.4  HCT 51.3  PLT 197    Chemistries  Recent Labs  Lab 07/18/17 1644 07/20/17 0440  NA 136 138  K 4.0 4.1  CL 105 107  CO2 20* 25  GLUCOSE 106* 100*  BUN 16 15  CREATININE 1.15 0.99  CALCIUM 9.4 8.7*  AST 29  --   ALT 33  --   ALKPHOS 84  --   BILITOT  0.4  --     Cardiac Enzymes Recent Labs  Lab 07/18/17 1644  TROPONINI <0.03      RADIOLOGY:  Ct Angio Neck W Or Wo Contrast  Result Date: 07/20/2017 CLINICAL DATA:  Bilateral carotid bifurcation stenosis. Left watershed territory infarcts. Factor V Leiden deficiency. EXAM: CT ANGIOGRAPHY NECK TECHNIQUE: Multidetector CT imaging of the neck was performed using the standard protocol during bolus administration of intravenous contrast. Multiplanar CT image reconstructions and MIPs were obtained to evaluate the vascular anatomy. Carotid stenosis measurements (when applicable) are obtained utilizing NASCET criteria, using the distal internal carotid diameter as the denominator. CONTRAST:  75mL ISOVUE-370 IOPAMIDOL (ISOVUE-370) INJECTION 76% COMPARISON:  MRI brain and MRA head and neck in 07/18/2017. FINDINGS: Aortic arch: The a 3 vessel arch configuration is present. There is minimal calcification along the underside of the arch. No significant stenosis  or aneurysm is present. Right carotid system: The right common carotid artery is within normal limits. A high-grade, near occlusive, stenosis is present at the origin of the right internal carotid artery. The lumen is not measurable. The more distal right ICA is within normal limits. Focal calcification is present in the petrous segment, just above the skullbase. There is no significant stenosis at that level. Left carotid system: The left common carotid artery is within normal limits. Dense calcifications and soft tissue plaque are present at the bifurcation. There is a high-grade, near occlusive stenosis of the left internal carotid artery with extensive soft plaque extending superiorly over 10 mm. No significant distal stenosis is present. The distal right internal carotid artery is slightly more narrow than on the left. Additional calcifications are present within the petrous segment without a significant tandem stenosis. Vertebral arteries: The vertebral artery is originate from the subclavian artery is without significant stenosis. Right vertebral artery is slightly dominant to the right. There is no significant stenosis in the neck. Vertebrobasilar junction is normal. PICA origins are visualized and normal bilaterally. Skeleton: Dextroconvex curvature is present in the cervical spine with asymmetric uncovertebral disease on the left at C3-4 and C4-5 in particular. Vertebral body heights are maintained. 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 Other neck: The soft tissues of the neck are otherwise unremarkable. The thyroid is within normal limits. There is no significant cervical adenopathy. Upper chest: The lung apices are clear. IMPRESSION: 1. Severe, near occlusive stenosis of the proximal internal carotid artery is bilaterally. 2. Soft tissue or fibrotic plaque more extensive left than right. This may represent acute soft plaque within the lumen on the left distal to the high-grade stenosis. 3. Slight decreased  caliber of the more distal left internal carotid artery compared to the right suggests a greater hemodynamic effect on the left, corresponding to the site of the patient's recent watershed type infarctions. These results were called by telephone at the time of interpretation on 07/20/2017 at 11:53 am to Drs. Aren Pryde and Durene Cal , who verbally acknowledged these results. Electronically Signed   By: Marin Roberts M.D.   On: 07/20/2017 12:10   US Venous Img Lower Bilateral  Result Date: 07/19/2017 CLINICAL DATA:  66 year old male with a history of swelling EXAM: BILATERAL LOWER EXTREMITY VENOUS DOPPLER ULTRASOUND TECHNIQUE: Gray-scale sonography with graded compression, as well as color Doppler and duplex ultrasound were performed to evaluate the lower extremity deep venous systems from the level of the common femoral vein and including the common femoral, femoral, profunda femoral, popliteal and calf veins including the posterior tibial, peroneal and gastrocnemius  veins when visible. The superficial great saphenous vein was also interrogated. Spectral Doppler was utilized to evaluate flow at rest and with distal augmentation maneuvers in the common femoral, femoral and popliteal veins. COMPARISON:  None. FINDINGS: RIGHT LOWER EXTREMITY Common Femoral Vein: No evidence of thrombus. Normal compressibility, respiratory phasicity and response to augmentation. Saphenofemoral Junction: No evidence of thrombus. Normal compressibility and flow on color Doppler imaging. Profunda Femoral Vein: No evidence of thrombus. Normal compressibility and flow on color Doppler imaging. Femoral Vein: No evidence of thrombus. Normal compressibility, respiratory phasicity and response to augmentation. Popliteal Vein: No evidence of thrombus. Normal compressibility, respiratory phasicity and response to augmentation. Calf Veins: No evidence of thrombus. Normal compressibility and flow on color Doppler imaging.  Superficial Great Saphenous Vein: No evidence of thrombus. Normal compressibility and flow on color Doppler imaging. Venous Reflux:  None. Other Findings:  None. LEFT LOWER EXTREMITY Occlusive thrombus involves the common femoral vein and femoral vein extending to the popliteal vein. Popliteal vein is patent. There is a paired femoral vein which remains patent in the thigh. Popliteal vein remains patent with compressible vein and flow maintained. Posterior tibial veins and peroneal veins are patent with no thrombus. Superficial Great Saphenous Vein: No evidence of thrombus. Normal compressibility and flow on color Doppler imaging. Other Findings:  None. IMPRESSION: Sonographic survey of the left lower extremity positive for acute DVT of the common femoral vein and 1 of the paired femoral veins. Sonographic survey of the right lower extremity negative for acute DVT. There is a paired left-sided femoral vein which remains patent. These results were called by by the completing technologist at the time of interpretation on 07/19/2017 at 4:23 pm to Dr. Alford HighlandICHARD Norah Devin. Signed, Yvone NeuJaime S. Loreta AveWagner, DO [facility] Electronically Signed   By: Gilmer MorJaime  Wagner D.O.   On: 07/19/2017 16:24      Management plans discussed with the patient, family and they are in agreement.  CODE STATUS:     Code Status Orders  (From admission, onward)        Start     Ordered   07/19/17 0006  Full code  Continuous     07/19/17 0005    Code Status History    Date Active Date Inactive Code Status Order ID Comments User Context   This patient has a current code status but no historical code status.      TOTAL TIME TAKING CARE OF THIS PATIENT: 35 minutes.    Alford Highlandichard Barkley Kratochvil M.D on 07/21/2017 at 3:26 PM  Between 7am to 6pm - Pager - [phone]  After 6pm go to [email or web address] - password [email or web address]  [employer] Office  [phone]  CC: Primary care  physician; Danella PentonMiller, Mark F, MD

## 2017-07-21 NOTE — Care Management Important Message (Signed)
Important Message  Patient Details  Name: Bryan BirchLarry A Hopfer Sr. MRN: [id number] Date of Birth: [birth year]   Medicare Important Message Given:  Yes    Gwenette GreetBrenda S Rilei Kravitz, RN 07/21/2017, 7:41 AM

## 2017-07-21 NOTE — Progress Notes (Signed)
Occupational Therapy Treatment Patient Details Name: Bryan BirchLarry A Harker Sr. MRN: [id number] DOB: 05/14/1951 Today's Date: 07/21/2017    History of present illness Bryan Holland  is a 66 y.o. male who presents with right arm weakness.  Patient states that he has been having some intermittent episodes of waxing and waning weakness over the last couple of weeks.  However on the day of presentation in the ED he was unable to lift his arm significantly and unable to use his distal motor function very much at all.  Here in the ED he was found on MRI imaging to have left-sided multiple strokes and watershed regions.   OT comments  Pt seen for OT tx this date. Spouse present for session. Pt/spouse educated in fine motor coordination and strengthening exercises. Pt/spouse endorse continued RUE AROM improvements. With testing, R shoulder flexion 4/5, elbow flexion/extension 4-/5, grip 3-/5. Pt/spouse educated in light ADL and IADL tasks to incorporate RUE in to support therapy at home. Pt/spouse eager to complete outpatient OT to continue addressing RUE deficits in order to maximize return to PLOF. Encouraged pt/spouse to follow up with physician regarding when it's safe to return to driving. Will continue to progress while in the hospital.    Follow Up Recommendations  Outpatient OT    Equipment Recommendations  None recommended by OT    Recommendations for Other Services      Precautions / Restrictions Precautions Precaution Comments: Decreased active movement in RUE but still has sensation intact Restrictions Weight Bearing Restrictions: No       Mobility Bed Mobility Overal bed mobility: Independent                Transfers Overall transfer level: Modified independent               General transfer comment: pt more cautious but no LOB noted, safely performs    Balance Overall balance assessment: Independent                                         ADL  either performed or assessed with clinical judgement   ADL Overall ADL's : Needs assistance/impaired   Eating/Feeding Details (indicate cue type and reason): Patient is left handed and can use left hand to perform tasks, unable to perform bilateral hand activities such as cutting food.  Grooming: Minimal assistance Grooming Details (indicate cue type and reason): unable to perform with right hand, able to do limited tasks with left hand                                     Vision Baseline Vision/History: Wears glasses Patient Visual Report: No change from baseline     Perception     Praxis      Cognition Arousal/Alertness: Awake/alert Behavior During Therapy: WFL for tasks assessed/performed Overall Cognitive Status: Within Functional Limits for tasks assessed                                          Exercises Other Exercises Other Exercises: Pt/spouse educated in fine motor coordination/strengthening exercises for RUE with verbal instruction, visual demonstration, and pt able to return demo good technique. Handout provided.    Shoulder Instructions  General Comments      Pertinent Vitals/ Pain       Pain Assessment: No/denies pain  Home Living                                          Prior Functioning/Environment              Frequency  Min 1X/week        Progress Toward Goals  OT Goals(current goals can now be found in the care plan section)  Progress towards OT goals: Progressing toward goals  Acute Rehab OT Goals Patient Stated Goal: to be back to normal, use my right arm again OT Goal Formulation: With patient/family Time For Goal Achievement: 07/26/17 Potential to Achieve Goals: Good  Plan Discharge plan remains appropriate;Frequency remains appropriate    Co-evaluation                 AM-PAC PT "6 Clicks" Daily Activity     Outcome Measure   Help from another person eating  meals?: A Little Help from another person taking care of personal grooming?: A Little Help from another person toileting, which includes using toliet, bedpan, or urinal?: A Little Help from another person bathing (including washing, rinsing, drying)?: A Little Help from another person to put on and taking off regular upper body clothing?: A Little Help from another person to put on and taking off regular lower body clothing?: A Little 6 Click Score: 18    End of Session    OT Visit Diagnosis: Muscle weakness (generalized) (M62.81);Hemiplegia and hemiparesis;Other (comment)(lack of coordination, ADL deficits) Hemiplegia - Right/Left: Right Hemiplegia - dominant/non-dominant: Non-Dominant Hemiplegia - caused by: Cerebral infarction   Activity Tolerance Patient tolerated treatment well   Patient Left in chair;with call bell/phone within reach;with family/visitor present   Nurse Communication          Time: [id number] OT Time Calculation (min): 23 min  Charges: OT General Charges $OT Visit: 1 Visit OT Treatments $Therapeutic Exercise: 23-37 mins  Bryan Holland, MPH, MS, OTR/L ascom [phone] 07/21/17, 2:32 PM

## 2017-07-21 NOTE — Progress Notes (Signed)
PT Cancellation Note  Patient Details Name: Bryan BirchLarry A Minckler Sr. MRN: [id number] DOB: 11/30/1950   Cancelled Treatment:    Reason Eval/Treat Not Completed: PT screened, no needs identified, will sign off. Spoke with RN and the pt who both confirm that the pt has been ambulating independently in room.  Pt denies any weakness in either LE and denies any unsteadiness when ambulating.  Pt reports his only deficit is his RUE which is being addressed by OT.  No skilled PT needs identified, PT will sign off.   Encarnacion ChuAshley Abashian PT, DPT 07/21/2017, 1:11 PM

## 2017-07-21 NOTE — Care Management Note (Addendum)
Case Management Note  Patient Details  Name: Bryan BirchLarry A Apo Sr. MRN: [id number] Date of Birth: 03/29/1951  Subjective/Objective:       Admitted to [facility] with the diagnosis of stroke. Lives with wife, Bryan Holland [phone]). Last seen Dr. Hyacinth MeekerMiller 07/18/17. Pres[place] at [facility] [place]. No home Health. No skilled facility. No medical equipment in the home. Takes care of all basic activities of daily living himself, drives. No falls. Good appetite, Wife will transport.              Action/Plan: Occupational therapy evaluation pending. Discussed discharge options. Would like to go to Outpatient therapy at this hospital. Dr. Renae GlossWieting signed referral. Will fax to rehab department. Will give Eliquis  30 day care.   Expected Discharge Date:                  Expected Discharge Plan:     In-House Referral:   yes  Discharge planning Services     Post Acute Care Choice:   yes Choice offered to:   patient  DME Arranged:    DME Agency:     HH Arranged:    HH Agency:     Status of Service:     If discussed at [employer] Meetings, dates discussed:    Additional Comments:  Gwenette GreetBrenda S Josias Tomerlin, RN MSN CCM Care Management [phone] 07/21/2017, 11:35 AM

## 2017-07-21 NOTE — Progress Notes (Signed)
*  PRELIMINARY RESULTS* Echocardiogram 2D Echocardiogram has been performed.  [name], [name] 07/21/2017, 9:43 AM

## 2017-07-21 NOTE — Plan of Care (Signed)
  Progressing Education: Knowledge of General Education information will improve 07/21/2017 1545 - Progressing by Luretha MurphyMiles, Andre Gallego, RN Health Behavior/Discharge Planning: Ability to manage health-related needs will improve 07/21/2017 1545 - Progressing by Luretha MurphyMiles, Zyair Russi, RN Clinical Measurements: Ability to maintain clinical measurements within normal limits will improve 07/21/2017 1545 - Progressing by Luretha MurphyMiles, Randee Upchurch, RN Pain Managment: General experience of comfort will improve 07/21/2017 1545 - Progressing by Luretha MurphyMiles, Antwan Bribiesca, RN Education: Knowledge of disease or condition will improve 07/21/2017 1545 - Progressing by Luretha MurphyMiles, Monic Engelmann, RN Knowledge of secondary prevention will improve 07/21/2017 1545 - Progressing by Luretha MurphyMiles, Aylssa Herrig, RN Knowledge of patient specific risk factors addressed and post discharge goals established will improve 07/21/2017 1545 - Progressing by Luretha MurphyMiles, Sharleen Szczesny, RN Coping: Will verbalize positive feelings about self 07/21/2017 1545 - Progressing by Luretha MurphyMiles, Bekah Igoe, RN Will identify appropriate support needs 07/21/2017 1545 - Progressing by Luretha MurphyMiles, Ida Uppal, RN Health Behavior/Discharge Planning: Ability to manage health-related needs will improve 07/21/2017 1545 - Progressing by Luretha MurphyMiles, Donavin Audino, RN Self-Care: Ability to participate in self-care as condition permits will improve 07/21/2017 1545 - Progressing by Luretha MurphyMiles, Clayton Jarmon, RN Nutrition: Risk of aspiration will decrease 07/21/2017 1545 - Progressing by Luretha MurphyMiles, Kerby Hockley, RN Ischemic Stroke/TIA Tissue Perfusion: Complications of ischemic stroke/TIA will be minimized 07/21/2017 1545 - Progressing by Luretha MurphyMiles, Lavell Ridings, RN

## 2017-07-21 NOTE — Progress Notes (Signed)
Spoke w/ pt and his wife about apixaban. Patient does not have a part D plan. Per wife they are looking into getting a part D plan or medicare advantage plan. Will give pt a coupon for first 30 days of Eliquis. Plan is to try to find Rx coverage within these first 30 days. If unable to get, they were advised to discuss other options (warfarin) with vascular. Patient wife works in [employer], [employer] billing and is familiar with insurance plans.  Olene FlossMelissa D Maccia, Pharm.D, BCPS Clinical Pharmacist

## 2017-07-21 NOTE — Progress Notes (Signed)
Patient scheduled for follow up with Dr. Gilda CreaseSchnier on Monday January 7 at 1:45.  Office will call the patient to confirm.  Bryan Holland Bryan Holland

## 2017-07-21 NOTE — Progress Notes (Signed)
Subjective: No new neurological complaints.  Reports some continued improvement in the RUE  Objective: Current vital signs: BP 134/89   Pulse 85   Temp 97.9 F (36.6 C) (Oral)   Resp 20   Ht 5\' 5"  (1.651 m)   Wt 88 kg (194 lb)   SpO2 98%   BMI 32.28 kg/m  Vital signs in last 24 hours: Temp:  [97.6 F (36.4 C)-98.1 F (36.7 C)] 97.9 F (36.6 C) (12/31 1007) Pulse Rate:  [68-98] 85 (12/31 1007) Resp:  [18-20] 20 (12/31 1007) BP: (134-159)/(80-103) 134/89 (12/31 1007) SpO2:  [93 %-98 %] 98 % (12/31 1007)  Intake/Output from previous day: 12/30 0701 - 12/31 0700 In: 1837 [P.O.:840; I.V.:997] Out: -  Intake/Output this shift: Total I/O In: 360 [P.O.:360] Out: -  Nutritional status: Diet Heart Room service appropriate? Yes; Fluid consistency: Thin  Neurologic Exam: Mental Status: Alert, oriented, thought content appropriate. Speech fluent without evidence of aphasia. Able to follow 3 step commands without difficulty. Cranial Nerves: II: Discs flat bilaterally; Visual fields grossly normal, pupils equal, round, reactive to light and accommodation III,IV, VI: ptosis not present, extra-ocular motions intact bilaterally V,VII: smile symmetric, facial light touch sensation normal bilaterally VIII: hearing normal bilaterally IX,X: gag reflex present XI: bilateral shoulder shrug XII: midline tongue extension Motor: Right :Upper extremity3+/5proximally, 2+/5 distallyLeft: Upper extremity 5/5 Lower extremity 5/5      Lower extremity 5/5   Lab Results: Basic Metabolic Panel: Recent Labs  Lab 07/18/17 1644 07/20/17 0440  NA 136 138  K 4.0 4.1  CL 105 107  CO2 20* 25  GLUCOSE 106* 100*  BUN 16 15  CREATININE 1.15 0.99  CALCIUM 9.4 8.7*    Liver Function Tests: Recent Labs  Lab 07/18/17 1644  AST 29  ALT 33  ALKPHOS 84  BILITOT 0.4   PROT 7.6  ALBUMIN 4.4   No results for input(s): LIPASE, AMYLASE in the last 168 hours. No results for input(s): AMMONIA in the last 168 hours.  CBC: Recent Labs  Lab 07/18/17 1644  WBC 8.0  NEUTROABS 5.1  HGB 17.4  HCT 51.3  MCV 94.4  PLT 197    Cardiac Enzymes: Recent Labs  Lab 07/18/17 1644  TROPONINI <0.03    Lipid Panel: Recent Labs  Lab 07/19/17 0328  CHOL 260*  TRIG 290*  HDL 45  CHOLHDL 5.8  VLDL 58*  LDLCALC 157*    CBG: Recent Labs  Lab 07/18/17 1647  GLUCAP 102*    Microbiology: No results found for this or any previous visit.  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 Factor V Leiden deficiency. EXAM: CT ANGIOGRAPHY NECK TECHNIQUE: Multidetector CT imaging of the neck was performed using the standard protocol during bolus administration of intravenous contrast. Multiplanar CT image reconstructions and MIPs were obtained to evaluate the vascular anatomy. Carotid stenosis measurements (when applicable) are obtained utilizing NASCET criteria, using the distal internal carotid diameter as the denominator. CONTRAST:  75mL ISOVUE-370 IOPAMIDOL (ISOVUE-370) INJECTION 76% COMPARISON:  MRI brain and MRA head and neck in 07/18/2017. FINDINGS: Aortic arch: The a 3 vessel arch configuration is present. There is minimal calcification along the underside of the arch. No significant stenosis or aneurysm is present. Right carotid system: The right common carotid artery is within normal limits. A  high-grade, near occlusive, stenosis is present at the origin of the right internal carotid artery. The lumen is not measurable. The more distal right ICA is within normal limits. Focal calcification is present in the petrous segment, just above the skullbase. There is no  significant stenosis at that level. Left carotid system: The left common carotid artery is within normal limits. Dense calcifications and soft tissue plaque are present at the bifurcation. There is a high-grade, near occlusive stenosis of the left internal carotid artery with extensive soft plaque extending superiorly over 10 mm. No significant distal stenosis is present. The distal right internal carotid artery is slightly more narrow than on the left. Additional calcifications are present within the petrous segment without a significant tandem stenosis. Vertebral arteries: The vertebral artery is originate from the subclavian artery is without significant stenosis. Right vertebral artery is slightly dominant to the right. There is no significant stenosis in the neck. Vertebrobasilar junction is normal. PICA origins are visualized and normal bilaterally. Skeleton: Dextroconvex curvature is present in the cervical spine with asymmetric uncovertebral disease on the left at C3-4 and C4-5 in particular. Vertebral body heights are maintained. 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 Other neck: The soft tissues of the neck are otherwise unremarkable. The thyroid is within normal limits. There is no significant cervical adenopathy. Upper chest: The lung apices are clear. IMPRESSION: 1. Severe, near occlusive stenosis of the proximal internal carotid artery is bilaterally. 2. Soft tissue or fibrotic plaque more extensive left than right. This may represent acute soft plaque within the lumen on the left distal to the high-grade stenosis. 3. Slight decreased caliber of the more distal left internal carotid artery compared to the right suggests a greater hemodynamic effect on the left, corresponding to the site of the patient's recent watershed type infarctions. These results were called by telephone at the time of interpretation on 07/20/2017 at 11:53 am to Drs. RICHARD WIETING and Durene CalWELLS BRABHAM , who verbally acknowledged these  results. Electronically Signed   By: Marin Robertshristopher  Mattern M.D.   On: 07/20/2017 12:10   [place] Venous Img Lower Bilateral  Result Date: 07/19/2017 CLINICAL DATA:  66 year old male with a history of swelling EXAM: BILATERAL LOWER EXTREMITY VENOUS DOPPLER ULTRASOUND TECHNIQUE: Gray-scale sonography with graded compression, as well as color Doppler and duplex ultrasound were performed to evaluate the lower extremity deep venous systems from the level of the common femoral vein and including the common femoral, femoral, profunda femoral, popliteal and calf veins including the posterior tibial, peroneal and gastrocnemius veins when visible. The superficial great saphenous vein was also interrogated. Spectral Doppler was utilized to evaluate flow at rest and with distal augmentation maneuvers in the common femoral, femoral and popliteal veins. COMPARISON:  None. FINDINGS: RIGHT LOWER EXTREMITY Common Femoral Vein: No evidence of thrombus. Normal compressibility, respiratory phasicity and response to augmentation. Saphenofemoral Junction: No evidence of thrombus. Normal compressibility and flow on color Doppler imaging. Profunda Femoral Vein: No evidence of thrombus. Normal compressibility and flow on color Doppler imaging. Femoral Vein: No evidence of thrombus. Normal compressibility, respiratory phasicity and response to augmentation. Popliteal Vein: No evidence of thrombus. Normal compressibility, respiratory phasicity and response to augmentation. Calf Veins: No evidence of thrombus. Normal compressibility and flow on color Doppler imaging. Superficial Great Saphenous Vein: No evidence of thrombus. Normal compressibility and flow on color Doppler imaging. Venous Reflux:  None. Other Findings:  None. LEFT LOWER EXTREMITY Occlusive thrombus involves the common femoral vein and femoral vein extending to the  popliteal vein. Popliteal vein is patent. There is a paired femoral vein which remains patent in the thigh.  Popliteal vein remains patent with compressible vein and flow maintained. Posterior tibial veins and peroneal veins are patent with no thrombus. Superficial Great Saphenous Vein: No evidence of thrombus. Normal compressibility and flow on color Doppler imaging. Other Findings:  None. IMPRESSION: Sonographic survey of the left lower extremity positive for acute DVT of the common femoral vein and 1 of the paired femoral veins. Sonographic survey of the right lower extremity negative for acute DVT. There is a paired left-sided femoral vein which remains patent. These results were called by by the completing technologist at the time of interpretation on 07/19/2017 at 4:23 pm to Dr. Alford HighlandICHARD WIETING. Signed, Yvone NeuJaime S. Loreta AveWagner, DO [facility] Electronically Signed   By: Gilmer MorJaime  Wagner D.O.   On: 07/19/2017 16:24    Medications:  I have reviewed the patient's current medications. Scheduled: . apixaban  5 mg Oral BID  . atorvastatin  40 mg Oral Daily  . sodium chloride flush  3 mL Intravenous Q12H    Assessment/Plan: No new neurological complaints.  CTA shows severe stenosis of the carotids bilaterally.  Vascular following.  Echocardiogram pending.    Agree with continued Eliquis.  May restart ASA 81mg  as well.     LOS: 3 days   Thana FarrLeslie Norwood Quezada, MD Neurology [phone] 07/21/2017  11:44 AM

## 2017-07-21 NOTE — Consult Note (Signed)
Reason for Consult: Acute CVA right-sided hemiparesis DVT Referring Physician: Dr. Loletha Grayer hospitalist  Bryan Loffler Sr. is an 66 y.o. male.  HPI: Patient history of hypertension hyperlipidemia presented with acute CVA with right-sided hemiparesis decreased sensation with weakness.  Patient had ongoing symptoms for 1-2 weeks before finally seeking medical attention.  Patient had some minor improvement in his right side his physical strength and dexterity.  After evaluation by MRI showed left-sided multiple strokes in a watershed region patient gives a history of no palpitations or tachycardia no previous cardiac or neurologic history.  He been on Lipitor in the past but quit taking it.  Patient denies chest pain denies any buccal spells or syncope.  Past Medical History:  Diagnosis Date  . Diverticulosis   . Factor V Leiden ([place])   . Hyperlipidemia   . Plantar fasciitis     Past Surgical History:  Procedure Laterality Date  . COLONOSCOPY WITH PROPOFOL N/A 03/17/2015   Procedure: COLONOSCOPY WITH PROPOFOL;  Surgeon: Lollie Sails, MD;  Location: [facility] END[facility]COPY;  Service: Endoscopy;  Laterality: N/A;  . VASECTOMY      Family History  Problem Relation Age of Onset  . CAD Mother   . Diabetes Mother   . CAD Father   . CAD Sister     Social History:  reports that  has never smoked. He has quit using smokeless tobacco. He reports that he does not use drugs. His alcohol history is not on file.  Allergies: No Known Allergies  Medications: I have reviewed the patient's current medications.  Results for orders placed or performed during the hospital encounter of 07/18/17 (from the past 48 hour(s))  Basic metabolic panel     Status: Abnormal   Collection Time: 07/20/17  4:40 AM  Result Value Ref Range   Sodium 138 135 - 145 mmol/L   Potassium 4.1 3.5 - 5.1 mmol/L   Chloride 107 101 - 111 mmol/L   CO2 25 22 - 32 mmol/L   Glucose, Bld 100 (H) 65 - 99 mg/dL   BUN 15 6 - 20  mg/dL   Creatinine, Ser 0.99 0.61 - 1.24 mg/dL   Calcium 8.7 (L) 8.9 - 10.3 mg/dL   GFR calc non Af Amer >60 >60 mL/min   GFR calc Af Amer >60 >60 mL/min    Comment: (NOTE) The eGFR has been calculated using the CKD EPI equation. This calculation has not been validated in all clinical situations. eGFR's persistently <60 mL/min signify possible Chronic Kidney Disease.    Anion gap 6 5 - 15    Comment: Performed at [facility], [address] [place], [place] [address]    Ct Angio Neck W Or Wo Contrast  Result Date: 07/20/2017 CLINICAL DATA:  Bilateral carotid bifurcation stenosis. Left watershed territory infarcts. Factor V Leiden deficiency. EXAM: CT ANGIOGRAPHY NECK TECHNIQUE: Multidetector CT imaging of the neck was performed using the standard protocol during bolus administration of intravenous contrast. Multiplanar CT image reconstructions and MIPs were obtained to evaluate the vascular anatomy. Carotid stenosis measurements (when applicable) are obtained utilizing NASCET criteria, using the distal internal carotid diameter as the denominator. CONTRAST:  66m ISOVUE-370 IOPAMIDOL (ISOVUE-370) INJECTION 76% COMPARISON:  MRI brain and MRA head and neck in 07/18/2017. FINDINGS: Aortic arch: The a 3 vessel arch configuration is present. There is minimal calcification along the underside of the arch. No significant stenosis or aneurysm is present. Right carotid system: The right common carotid artery is within normal  limits. A high-grade, near occlusive, stenosis is present at the origin of the right internal carotid artery. The lumen is not measurable. The more distal right ICA is within normal limits. Focal calcification is present in the petrous segment, just above the skullbase. There is no significant stenosis at that level. Left carotid system: The left common carotid artery is within normal limits. Dense calcifications and soft tissue plaque are present at the bifurcation. There  is a high-grade, near occlusive stenosis of the left internal carotid artery with extensive soft plaque extending superiorly over 10 mm. No significant distal stenosis is present. The distal right internal carotid artery is slightly more narrow than on the left. Additional calcifications are present within the petrous segment without a significant tandem stenosis. Vertebral arteries: The vertebral artery is originate from the subclavian artery is without significant stenosis. Right vertebral artery is slightly dominant to the right. There is no significant stenosis in the neck. Vertebrobasilar junction is normal. PICA origins are visualized and normal bilaterally. Skeleton: Dextroconvex curvature is present in the cervical spine with asymmetric uncovertebral disease on the left at C3-4 and C4-5 in particular. Vertebral body heights are maintained. 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 Other neck: The soft tissues of the neck are otherwise unremarkable. The thyroid is within normal limits. There is no significant cervical adenopathy. Upper chest: The lung apices are clear. IMPRESSION: 1. Severe, near occlusive stenosis of the proximal internal carotid artery is bilaterally. 2. Soft tissue or fibrotic plaque more extensive left than right. This may represent acute soft plaque within the lumen on the left distal to the high-grade stenosis. 3. Slight decreased caliber of the more distal left internal carotid artery compared to the right suggests a greater hemodynamic effect on the left, corresponding to the site of the patient's recent watershed type infarctions. These results were called by telephone at the time of interpretation on 07/20/2017 at 11:53 am to Drs. RICHARD WIETING and Annamarie Major , who verbally acknowledged these results. Electronically Signed   By: San Morelle M.D.   On: 07/20/2017 12:10   US Venous Img Lower Bilateral  Result Date: 07/19/2017 CLINICAL DATA:  66 year old male with a  history of swelling EXAM: BILATERAL LOWER EXTREMITY VENOUS DOPPLER ULTRASOUND TECHNIQUE: Gray-scale sonography with graded compression, as well as color Doppler and duplex ultrasound were performed to evaluate the lower extremity deep venous systems from the level of the common femoral vein and including the common femoral, femoral, profunda femoral, popliteal and calf veins including the posterior tibial, peroneal and gastrocnemius veins when visible. The superficial great saphenous vein was also interrogated. Spectral Doppler was utilized to evaluate flow at rest and with distal augmentation maneuvers in the common femoral, femoral and popliteal veins. COMPARISON:  None. FINDINGS: RIGHT LOWER EXTREMITY Common Femoral Vein: No evidence of thrombus. Normal compressibility, respiratory phasicity and response to augmentation. Saphenofemoral Junction: No evidence of thrombus. Normal compressibility and flow on color Doppler imaging. Profunda Femoral Vein: No evidence of thrombus. Normal compressibility and flow on color Doppler imaging. Femoral Vein: No evidence of thrombus. Normal compressibility, respiratory phasicity and response to augmentation. Popliteal Vein: No evidence of thrombus. Normal compressibility, respiratory phasicity and response to augmentation. Calf Veins: No evidence of thrombus. Normal compressibility and flow on color Doppler imaging. Superficial Great Saphenous Vein: No evidence of thrombus. Normal compressibility and flow on color Doppler imaging. Venous Reflux:  None. Other Findings:  None. LEFT LOWER EXTREMITY Occlusive thrombus involves the common femoral vein and femoral vein extending  to the popliteal vein. Popliteal vein is patent. There is a paired femoral vein which remains patent in the thigh. Popliteal vein remains patent with compressible vein and flow maintained. Posterior tibial veins and peroneal veins are patent with no thrombus. Superficial Great Saphenous Vein: No evidence of  thrombus. Normal compressibility and flow on color Doppler imaging. Other Findings:  None. IMPRESSION: Sonographic survey of the left lower extremity positive for acute DVT of the common femoral vein and 1 of the paired femoral veins. Sonographic survey of the right lower extremity negative for acute DVT. There is a paired left-sided femoral vein which remains patent. These results were called by by the completing technologist at the time of interpretation on 07/19/2017 at 4:23 pm to Dr. Loletha Grayer. Signed, Dulcy Fanny. Earleen Newport, DO [facility] Electronically Signed   By: Corrie Mckusick D.O.   On: 07/19/2017 16:24    Review of Systems  Constitutional: Positive for malaise/fatigue.  HENT: Negative.   Eyes: Negative.   Respiratory: Positive for shortness of breath.   Gastrointestinal: Negative.   Genitourinary: Negative.   Musculoskeletal: Positive for myalgias.  Skin: Negative.   Neurological: Positive for sensory change, focal weakness and weakness.  Endo/Heme/Allergies: Negative.   Psychiatric/Behavioral: Negative.    Blood pressure (!) 160/99, pulse 75, temperature 97.7 F (36.5 C), temperature source Oral, resp. rate 20, height '5\' 5"'  (1.651 m), weight 194 lb (88 kg), SpO2 97 %. Physical Exam  Nursing note and vitals reviewed. Constitutional: He is oriented to person, place, and time. He appears well-developed and well-nourished.  HENT:  Head: Normocephalic and atraumatic.  Right Ear: External ear normal.  Eyes: Conjunctivae and EOM are normal. Pupils are equal, round, and reactive to light.  Neck: Normal range of motion. Neck supple.  Cardiovascular: Normal rate, regular rhythm and normal heart sounds.  Respiratory: Effort normal and breath sounds normal.  GI: Soft. Bowel sounds are normal.  Musculoskeletal: Normal range of motion.  Neurological: He is alert and oriented to person, place, and time. He has normal reflexes. He  exhibits abnormal muscle tone. Coordination abnormal.  Skin: Skin is warm and dry.  Psychiatric: He has a normal mood and affect.  Right arm hemiparesis  Assessment/Plan: CVA Peripheral vascular disease  carotid stenosis bilateral Right-sided hemiparesis DVT Hypertension Factor V Leiden Hyperlipidemia . Plan Agree with vascular evaluation Recommend aspirin as well as Eliquis Consider low-dose beta blockade therapy preop Agree with preoperative evaluation prior to CEA on the left Restart lipid management with Lipitor Consider Plavix in the future long-term Patient will need physical therapy for hemiparesis Recommend functional study for evaluation of coronary disease which can be done later date as an outpatient He appears to be an acceptable risk for left CEA procedure   Annalucia Laino D Aayan Haskew 07/21/2017, 2:59 PM

## 2017-07-22 DIAGNOSIS — M7542 Impingement syndrome of left shoulder: Secondary | ICD-10-CM

## 2017-07-22 DIAGNOSIS — I82409 Acute embolism and thrombosis of unspecified deep veins of unspecified lower extremity: Secondary | ICD-10-CM

## 2017-07-22 HISTORY — DX: Impingement syndrome of left shoulder: M75.42

## 2017-07-22 HISTORY — DX: Acute embolism and thrombosis of unspecified deep veins of unspecified lower extremity: I82.409

## 2017-07-24 DIAGNOSIS — I6523 Occlusion and stenosis of bilateral carotid arteries: Secondary | ICD-10-CM | POA: Insufficient documentation

## 2017-07-24 DIAGNOSIS — Z8673 Personal history of transient ischemic attack (TIA), and cerebral infarction without residual deficits: Secondary | ICD-10-CM | POA: Insufficient documentation

## 2017-07-24 LAB — FACTOR 5 LEIDEN

## 2017-07-27 DIAGNOSIS — D6851 Activated protein C resistance: Secondary | ICD-10-CM | POA: Insufficient documentation

## 2017-07-27 DIAGNOSIS — I82409 Acute embolism and thrombosis of unspecified deep veins of unspecified lower extremity: Secondary | ICD-10-CM | POA: Insufficient documentation

## 2017-07-27 NOTE — Progress Notes (Signed)
MRN : [id number]  Bryan COOR Sr. is a 67 y.o. (1951-07-18) male who presents with chief complaint of No chief complaint on file. [name]  History of Present Illness: The patient is seen for evaluation of carotid stenosis. The carotid stenosis was identified after he woke up with right arm weakness.  On initial evaluation he did note he had several episodes over the past few weeks.  He denies speech changes he denies amaurosis fugax.  No changes in the strength of his lower extremities.  The patient is a true left-handed individual  There is no prior documented CVA.  There is no history of migraine headaches. There is no history of seizures.  The patient is taking enteric-coated aspirin 81 mg daily.  Of significance the patient does have a history of factor V Leiden and does describe episodes of thrombophlebitis which occurred in the past some of what he states is consistent with a superficial thrombophlebitis but some of his description is also consistent with deep vein thrombosis as he was anticoagulated for a time.  During this admission he was noted to have swelling and pain of his left lower extremity and was found by ultrasound to have acute DVT.  It was elected to not anticoagulate him initially but given his stroke however on presentation to the office he has been started on Eliquis.  The patient has a history of coronary artery disease, no recent episodes of angina or shortness of breath. The patient denies PAD or claudication symptoms. There is a history of hyperlipidemia which is being treated with a statin.    No outpatient medications have been marked as taking for the 07/28/17 encounter (Appointment) with Gilda Crease, Latina Craver, MD.    Past Medical History:  Diagnosis Date  . Diverticulosis   . Factor V Leiden (HCC)   . Hyperlipidemia   . Plantar fasciitis     Past Surgical History:  Procedure Laterality Date  . COLONOSCOPY WITH PROPOFOL N/A 03/17/2015   Procedure:  COLONOSCOPY WITH PROPOFOL;  Surgeon: Christena Deem, MD;  Location: [facility] ENDOSCOPY;  Service: Endoscopy;  Laterality: N/A;  . VASECTOMY      Social History Social History   Tobacco Use  . Smoking status: Never Smoker  . Smokeless tobacco: Former Engineer, water Use Topics  . Alcohol use: Not on file  . Drug use: No    Family History Family History  Problem Relation Age of Onset  . CAD Mother   . Diabetes Mother   . CAD Father   . CAD Sister   No family history of bleeding/clotting disorders, porphyria or autoimmune disease   No Known Allergies   REVIEW OF SYSTEMS (Negative unless checked)  Constitutional: [] Weight loss  [] Fever  [] Chills Cardiac: [] Chest pain   [] Chest pressure   [] Palpitations   [] Shortness of breath when laying flat   [] Shortness of breath with exertion. Vascular:  [] Pain in legs with walking   [] Pain in legs at rest  [] History of DVT   [] Phlebitis   [] Swelling in legs   [] Varicose veins   [] Non-healing ulcers Pulmonary:   [] Uses home oxygen   [] Productive cough   [] Hemoptysis   [] Wheeze  [] COPD   [] Asthma Neurologic:  [] Dizziness   [] Seizures   [x] History of stroke   [] History of TIA  [] Aphasia   [] Vissual changes   [x] Weakness or numbness in arm   [] Weakness or numbness in leg Musculoskeletal:   [] Joint swelling   [] Joint pain   [] Low  back pain Hematologic:  [] Easy bruising  [] Easy bleeding   [] Hypercoagulable state   [] Anemic Gastrointestinal:  [] Diarrhea   [] Vomiting  [] Gastroesophageal reflux/heartburn   [] Difficulty swallowing. Genitourinary:  [] Chronic kidney disease   [] Difficult urination  [] Frequent urination   [] Blood in urine Skin:  [] Rashes   [] Ulcers  Psychological:  [] History of anxiety   []  History of major depression.  Physical Examination  There were no vitals filed for this visit. There is no height or weight on file to calculate BMI. Gen: WD/WN, NAD Head: Portal/AT, No temporalis wasting.  Ear/Nose/Throat: Hearing grossly intact,  nares w/o erythema or drainage, poor dentition Eyes: PER, EOMI, sclera nonicteric.  Neck: Supple, no masses.  No bruit or JVD.  Pulmonary:  Good air movement, clear to auscultation bilaterally, no use of accessory muscles.  Cardiac: RRR, normal S1, S2, no Murmurs. Vascular: bilateral carotid bruit Vessel Right Left  Radial Palpable Palpable  Ulnar Palpable Palpable  Brachial Palpable Palpable  Carotid Palpable Palpable   Gastrointestinal: soft, non-distended. No guarding/no peritoneal signs.  Musculoskeletal: M/S 5/5 throughout legs and left arm; right arm with 3/5 motor normal sensory.  No deformity or atrophy.  Neurologic: CN 2-12 intact. Pain and light touch intact in extremities.  Symmetrical.  Speech is fluent. Motor exam as listed above. Psychiatric: Judgment intact, Mood & affect appropriate for pt's clinical situation. Dermatologic: No rashes or ulcers noted.  No changes consistent with cellulitis. Lymph : No Cervical lymphadenopathy, no lichenification or skin changes of chronic lymphedema.  CBC Lab Results  Component Value Date   WBC 8.0 07/18/2017   HGB 17.4 07/18/2017   HCT 51.3 07/18/2017   MCV 94.4 07/18/2017   PLT 197 07/18/2017    BMET    Component Value Date/Time   NA 138 07/20/2017 0440   K 4.1 07/20/2017 0440   CL 107 07/20/2017 0440   CO2 25 07/20/2017 0440   GLUCOSE 100 (H) 07/20/2017 0440   BUN 15 07/20/2017 0440   CREATININE 0.99 07/20/2017 0440   CALCIUM 8.7 (L) 07/20/2017 0440   GFRNONAA >60 07/20/2017 0440   GFRAA >60 07/20/2017 0440   Estimated Creatinine Clearance: 74.9 mL/min (by C-G formula based on SCr of 0.99 mg/dL).  COAG Lab Results  Component Value Date   INR 0.83 07/18/2017    Radiology Ct Head Wo Contrast  Result Date: 07/18/2017 CLINICAL DATA:  Progressive right arm weakness for 2 weeks. EXAM: CT HEAD WITHOUT CONTRAST TECHNIQUE: Contiguous axial images were obtained from the base of the skull through the vertex without  intravenous contrast. COMPARISON:  07/03/2011 FINDINGS: Brain: There is no evidence of acute large territory infarct, intracranial hemorrhage, mass, midline shift, or extra-axial fluid collection. The ventricles and sulci are normal. Scattered small foci of cerebral white matter hypodensity are new from the prior study and most notable in the subcortical left parietal lobe and left centrum semiovale. Vascular: No hyperdense vessel. Skull: No fracture or focal osseous lesion. Sinuses/Orbits: Visualized paranasal sinuses and mastoid air cells are clear. Visualized orbits are unremarkable. Other: None. IMPRESSION: 1. No evidence of acute cortically based infarct or intracranial hemorrhage. 2. New small foci of cerebral white matter hypodensity most notable in the left frontoparietal region, nonspecific though may reflect age-indeterminate small vessel ischemia. Electronically Signed   By: Sebastian Ache M.D.   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 Factor V Leiden deficiency.  EXAM: CT ANGIOGRAPHY NECK TECHNIQUE: Multidetector CT imaging of the neck was performed using the standard protocol during bolus administration of intravenous contrast. Multiplanar CT image reconstructions and MIPs were obtained to evaluate the vascular anatomy. Carotid stenosis measurements (when applicable) are obtained utilizing NASCET criteria, using the distal internal carotid diameter as the denominator. CONTRAST:  75mL ISOVUE-370 IOPAMIDOL (ISOVUE-370) INJECTION 76% COMPARISON:  MRI brain and MRA head and neck in 07/18/2017. FINDINGS: Aortic arch: The a 3 vessel arch configuration is present. There is minimal calcification along the underside of the arch. No significant stenosis or aneurysm is present. Right carotid system: The right common carotid artery is within normal limits. A high-grade, near occlusive,  stenosis is present at the origin of the right internal carotid artery. The lumen is not measurable. The more distal right ICA is within normal limits. Focal calcification is present in the petrous segment, just above the skullbase. There is no significant stenosis at that level. Left carotid system: The left common carotid artery is within normal limits. Dense calcifications and soft tissue plaque are present at the bifurcation. There is a high-grade, near occlusive stenosis of the left internal carotid artery with extensive soft plaque extending superiorly over 10 mm. No significant distal stenosis is present. The distal right internal carotid artery is slightly more narrow than on the left. Additional calcifications are present within the petrous segment without a significant tandem stenosis. Vertebral arteries: The vertebral artery is originate from the subclavian artery is without significant stenosis. Right vertebral artery is slightly dominant to the right. There is no significant stenosis in the neck. Vertebrobasilar junction is normal. PICA origins are visualized and normal bilaterally. Skeleton: Dextroconvex curvature is present in the cervical spine with asymmetric uncovertebral disease on the left at C3-4 and C4-5 in particular. Vertebral body heights are maintained. 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 Other neck: The soft tissues of the neck are otherwise unremarkable. The thyroid is within normal limits. There is no significant cervical adenopathy. Upper chest: The lung apices are clear. IMPRESSION: 1. Severe, near occlusive stenosis of the proximal internal carotid artery is bilaterally. 2. Soft tissue or fibrotic plaque more extensive left than right. This may represent acute soft plaque within the lumen on the left distal to the high-grade stenosis. 3. Slight decreased caliber of the more distal left internal carotid artery compared to the right suggests a greater hemodynamic effect on the left,  corresponding to the site of the patient's recent watershed type infarctions. These results were called by telephone at the time of interpretation on 07/20/2017 at 11:53 am to Drs. RICHARD WIETING and Durene Cal , who verbally acknowledged these results. Electronically Signed   By: Marin Roberts M.D.   On: 07/20/2017 12:10   Mr Maxine Glenn Head Wo Contrast  Result Date: 07/18/2017 CLINICAL DATA:  Right arm weakness EXAM: MRI HEAD WITHOUT CONTRAST MRA HEAD WITHOUT CONTRAST MRA NECK WITHOUT CONTRAST TECHNIQUE: Multiplanar, multiecho pulse sequences of the brain and surrounding structures were obtained without intravenous contrast. Angiographic images of the Circle of Willis were obtained using MRA technique without intravenous contrast. Angiographic images of the neck were obtained using MRA technique without intravenous contrast. Carotid stenosis measurements (when applicable) are obtained utilizing NASCET criteria, using the distal internal carotid diameter as the denominator. COMPARISON:  Head CT 07/18/2017 FINDINGS: MRI HEAD FINDINGS Brain: The midline structures are normal. There is multifocal hyperintensity on diffusion-weighted imaging in the left hemisphere, along the margins between the anterior and middle cerebral arteries and  margin between the middle and posterior cerebral arteries. ADC map shows varying degrees of corresponding diffusion restriction. There is corresponding hyperintense T2-weighted signal. Otherwise, the brain parenchyma is normal. No mass lesion. No chronic microhemorrhage or cerebral amyloid angiopathy. No hydrocephalus, age advanced atrophy or lobar predominant volume loss. No dural abnormality or extra-axial collection. Skull and upper cervical spine: The visualized skull base, calvarium, upper cervical spine and extracranial soft tissues are normal. Sinuses/Orbits: No fluid levels or advanced mucosal thickening. No mastoid effusion. Normal orbits. MRA HEAD FINDINGS Intracranial  internal carotid arteries: Normal. Anterior cerebral arteries: Normal. Middle cerebral arteries: Normal. Posterior communicating arteries: Present on the right. Posterior cerebral arteries: Normal. Basilar artery: Normal. Vertebral arteries: Left dominant. Normal. Superior cerebellar arteries: Normal. Anterior inferior cerebellar arteries: Poorly visualized. Posterior inferior cerebellar arteries: Normal. MRA NECK FINDINGS There is diminished flow related enhancement within both proximal internal carotid arteries with approximately 75% stenosis. Both vertebral arteries are normal caliber along their entire visualized length. There is a 3 vessel aortic arch branching pattern. IMPRESSION: 1. Numerous foci of acute ischemia within the left hemisphere along the left ACA-MCA and MCA-PCA watershed zones. The pattern is most consistent with a hypotensive/hypoperfusion infarct. 2. No hemorrhage or mass effect. 3. No emergent large vessel occlusion. 4. 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 Confirmation with CT angiography of the neck or carotid ultrasound is recommended, as this study is prone to artifacts. Electronically Signed   By: Deatra Robinson M.D.   On: 07/18/2017 21:22   Mr Maxine Glenn Neck Wo Contrast  Result Date: 07/18/2017 CLINICAL DATA:  Right arm weakness EXAM: MRI HEAD WITHOUT CONTRAST MRA HEAD WITHOUT CONTRAST MRA NECK WITHOUT CONTRAST TECHNIQUE: Multiplanar, multiecho pulse sequences of the brain and surrounding structures were obtained without intravenous contrast. Angiographic images of the Circle of Willis were obtained using MRA technique without intravenous contrast. Angiographic images of the neck were obtained using MRA technique without intravenous contrast. Carotid stenosis measurements (when applicable) are obtained utilizing NASCET criteria, using the distal internal carotid diameter as the denominator. COMPARISON:  Head CT 07/18/2017  FINDINGS: MRI HEAD FINDINGS Brain: The midline structures are normal. There is multifocal hyperintensity on diffusion-weighted imaging in the left hemisphere, along the margins between the anterior and middle cerebral arteries and margin between the middle and posterior cerebral arteries. ADC map shows varying degrees of corresponding diffusion restriction. There is corresponding hyperintense T2-weighted signal. Otherwise, the brain parenchyma is normal. No mass lesion. No chronic microhemorrhage or cerebral amyloid angiopathy. No hydrocephalus, age advanced atrophy or lobar predominant volume loss. No dural abnormality or extra-axial collection. Skull and upper cervical spine: The visualized skull base, calvarium, upper cervical spine and extracranial soft tissues are normal. Sinuses/Orbits: No fluid levels or advanced mucosal thickening. No mastoid effusion. Normal orbits. MRA HEAD FINDINGS Intracranial internal carotid arteries: Normal. Anterior cerebral arteries: Normal. Middle cerebral arteries: Normal. Posterior communicating arteries: Present on the right. Posterior cerebral arteries: Normal. Basilar artery: Normal. Vertebral arteries: Left dominant. Normal. Superior cerebellar arteries: Normal. Anterior inferior cerebellar arteries: Poorly visualized. Posterior inferior cerebellar arteries: Normal. MRA NECK FINDINGS There is diminished flow related enhancement within both proximal internal carotid arteries with approximately 75% stenosis. Both vertebral arteries are normal caliber along their entire visualized length. There is a 3 vessel aortic arch branching pattern. IMPRESSION: 1. Numerous foci of acute ischemia within the left hemisphere along the left ACA-MCA and MCA-PCA watershed zones. The pattern is most consistent with a hypotensive/hypoperfusion infarct.  2. No hemorrhage or mass effect. 3. No emergent large vessel occlusion. 4. Greater than 75% stenosis of both proximal internal carotid arteries  demonstrated on time-of-flight MRA. Confirmation with CT angiography of the neck or carotid ultrasound is recommended, as this study is prone to artifacts. Electronically Signed   By: Deatra Robinson M.D.   On: 07/18/2017 21:22   Mr Brain Wo Contrast  Result Date: 07/18/2017 CLINICAL DATA:  Right arm weakness EXAM: MRI HEAD WITHOUT CONTRAST MRA HEAD WITHOUT CONTRAST MRA NECK WITHOUT CONTRAST TECHNIQUE: Multiplanar, multiecho pulse sequences of the brain and surrounding structures were obtained without intravenous contrast. Angiographic images of the Circle of Willis were obtained using MRA technique without intravenous contrast. Angiographic images of the neck were obtained using MRA technique without intravenous contrast. Carotid stenosis measurements (when applicable) are obtained utilizing NASCET criteria, using the distal internal carotid diameter as the denominator. COMPARISON:  Head CT 07/18/2017 FINDINGS: MRI HEAD FINDINGS Brain: The midline structures are normal. There is multifocal hyperintensity on diffusion-weighted imaging in the left hemisphere, along the margins between the anterior and middle cerebral arteries and margin between the middle and posterior cerebral arteries. ADC map shows varying degrees of corresponding diffusion restriction. There is corresponding hyperintense T2-weighted signal. Otherwise, the brain parenchyma is normal. No mass lesion. No chronic microhemorrhage or cerebral amyloid angiopathy. No hydrocephalus, age advanced atrophy or lobar predominant volume loss. No dural abnormality or extra-axial collection. Skull and upper cervical spine: The visualized skull base, calvarium, upper cervical spine and extracranial soft tissues are normal. Sinuses/Orbits: No fluid levels or advanced mucosal thickening. No mastoid effusion. Normal orbits. MRA HEAD FINDINGS Intracranial internal carotid arteries: Normal. Anterior cerebral arteries: Normal. Middle cerebral arteries: Normal.  Posterior communicating arteries: Present on the right. Posterior cerebral arteries: Normal. Basilar artery: Normal. Vertebral arteries: Left dominant. Normal. Superior cerebellar arteries: Normal. Anterior inferior cerebellar arteries: Poorly visualized. Posterior inferior cerebellar arteries: Normal. MRA NECK FINDINGS There is diminished flow related enhancement within both proximal internal carotid arteries with approximately 75% stenosis. Both vertebral arteries are normal caliber along their entire visualized length. There is a 3 vessel aortic arch branching pattern. IMPRESSION: 1. Numerous foci of acute ischemia within the left hemisphere along the left ACA-MCA and MCA-PCA watershed zones. The pattern is most consistent with a hypotensive/hypoperfusion infarct. 2. No hemorrhage or mass effect. 3. No emergent large vessel occlusion. 4. 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 Confirmation with CT angiography of the neck or carotid ultrasound is recommended, as this study is prone to artifacts. Electronically Signed   By: Deatra Robinson M.D.   On: 07/18/2017 21:22   US Venous Img Lower Bilateral  Result Date: 07/19/2017 CLINICAL DATA:  67 year old male with a history of swelling EXAM: BILATERAL LOWER EXTREMITY VENOUS DOPPLER ULTRASOUND TECHNIQUE: Gray-scale sonography with graded compression, as well as color Doppler and duplex ultrasound were performed to evaluate the lower extremity deep venous systems from the level of the common femoral vein and including the common femoral, femoral, profunda femoral, popliteal and calf veins including the posterior tibial, peroneal and gastrocnemius veins when visible. The superficial great saphenous vein was also interrogated. Spectral Doppler was utilized to evaluate flow at rest and with distal augmentation maneuvers in the common femoral, femoral and popliteal veins. COMPARISON:  None. FINDINGS: RIGHT  LOWER EXTREMITY Common Femoral Vein: No evidence of thrombus. Normal compressibility, respiratory phasicity and response to augmentation. Saphenofemoral Junction: No evidence of thrombus. Normal compressibility and flow  on color Doppler imaging. Profunda Femoral Vein: No evidence of thrombus. Normal compressibility and flow on color Doppler imaging. Femoral Vein: No evidence of thrombus. Normal compressibility, respiratory phasicity and response to augmentation. Popliteal Vein: No evidence of thrombus. Normal compressibility, respiratory phasicity and response to augmentation. Calf Veins: No evidence of thrombus. Normal compressibility and flow on color Doppler imaging. Superficial Great Saphenous Vein: No evidence of thrombus. Normal compressibility and flow on color Doppler imaging. Venous Reflux:  None. Other Findings:  None. LEFT LOWER EXTREMITY Occlusive thrombus involves the common femoral vein and femoral vein extending to the popliteal vein. Popliteal vein is patent. There is a paired femoral vein which remains patent in the thigh. Popliteal vein remains patent with compressible vein and flow maintained. Posterior tibial veins and peroneal veins are patent with no thrombus. Superficial Great Saphenous Vein: No evidence of thrombus. Normal compressibility and flow on color Doppler imaging. Other Findings:  None. IMPRESSION: Sonographic survey of the left lower extremity positive for acute DVT of the common femoral vein and 1 of the paired femoral veins. Sonographic survey of the right lower extremity negative for acute DVT. There is a paired left-sided femoral vein which remains patent. These results were called by by the completing technologist at the time of interpretation on 07/19/2017 at 4:23 pm to Dr. Alford Highland. Signed, Yvone Neu. Loreta Ave, DO [facility] Rad[facility] Electronically Signed   By: Gilmer Mor D.O.   On: 07/19/2017 16:24      Assessment/Plan 1. Stenosis of left carotid artery Recommend:  The patient is symptomatic with respect to the carotid stenosis.  The patient has a lesion that is >70%.  Patient's CT angiography of the carotid arteries confirms >70% left ICA stenosis.  The anatomical considerations support stenting over surgery.  This was discussed in detail with the patient.  The risks, benefits and alternative therapies were reviewed in detail with the patient.  All questions were answered.  The patient agrees to proceed with stenting of the left carotid artery.  The right carotid will be addressed in 4 weeks.  Continue antiplatelet therapy as prescribed. Continue management of CAD, HTN and Hyperlipidemia. Healthy heart diet, encouraged exercise at least 4 times per week.   2. 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 Acute deep vein thrombosis (DVT) of femoral vein, unspecified laterality (HCC) Recommend:   No surgery or intervention at this point in time.  IVC filter is not indicated at present as we will be stenting his carotid.  Patient's duplex ultrasound of the venous system shows DVT from the popliteal to the femoral veins.  The patient is initiated on anticoagulation.  Patient now on Eliquis  Elevation was stressed, use of a recliner was discussed.  I have had a long discussion with the patient regarding DVT and post phlebitic changes such as swelling and why it  causes symptoms such as pain.  The patient will wear graduated compression stockings class 1 (20-30 mmHg), beginning after three full days of anticoagulation, on a daily basis a prescription was given. The patient will  beginning wearing the stockings first thing in the morning and removing them in the evening. The patient is instructed specifically not to sleep in the stockings.  In addition, behavioral  modification including elevation during the day and avoidance of prolonged dependency will be initiated.    The patient will continue  anticoagulation for now as there have not been any problems or complications at this point.    4. Factor V Leiden (HCC) Continue anticoagulation    Levora DredgeGregory Porchea Charrier, MD  07/27/2017 4:30 PM

## 2017-07-28 ENCOUNTER — Encounter (INDEPENDENT_AMBULATORY_CARE_PROVIDER_SITE_OTHER): Payer: Self-pay

## 2017-07-28 ENCOUNTER — Ambulatory Visit (INDEPENDENT_AMBULATORY_CARE_PROVIDER_SITE_OTHER): Payer: Medicare Other | Admitting: Vascular Surgery

## 2017-07-28 ENCOUNTER — Encounter (INDEPENDENT_AMBULATORY_CARE_PROVIDER_SITE_OTHER): Payer: Self-pay | Admitting: Vascular Surgery

## 2017-07-28 VITALS — BP 136/84 | HR 61 | Resp 16 | Ht 65.0 in | Wt 191.0 lb

## 2017-07-28 DIAGNOSIS — D6851 Activated protein C resistance: Secondary | ICD-10-CM | POA: Diagnosis not present

## 2017-07-28 DIAGNOSIS — I6522 Occlusion and stenosis of left carotid artery: Secondary | ICD-10-CM | POA: Diagnosis not present

## 2017-07-28 DIAGNOSIS — I82419 Acute embolism and thrombosis of unspecified femoral vein: Secondary | ICD-10-CM

## 2017-07-28 DIAGNOSIS — I63442 Cerebral infarction due to embolism of left cerebellar artery: Secondary | ICD-10-CM | POA: Diagnosis not present

## 2017-07-31 ENCOUNTER — Encounter (INDEPENDENT_AMBULATORY_CARE_PROVIDER_SITE_OTHER): Payer: Self-pay | Admitting: Vascular Surgery

## 2017-08-04 ENCOUNTER — Other Ambulatory Visit (INDEPENDENT_AMBULATORY_CARE_PROVIDER_SITE_OTHER): Payer: Self-pay | Admitting: Vascular Surgery

## 2017-08-05 ENCOUNTER — Encounter
Admission: RE | Admit: 2017-08-05 | Discharge: 2017-08-05 | Disposition: A | Discharge status: Home / Self Care | Payer: Medicare Other | Source: Ambulatory Visit | Attending: Vascular Surgery | Admitting: Vascular Surgery

## 2017-08-05 DIAGNOSIS — Z01810 Encounter for preprocedural cardiovascular examination: Secondary | ICD-10-CM | POA: Insufficient documentation

## 2017-08-05 DIAGNOSIS — I6529 Occlusion and stenosis of unspecified carotid artery: Secondary | ICD-10-CM | POA: Insufficient documentation

## 2017-08-05 HISTORY — DX: Occlusion and stenosis of unspecified carotid artery: I65.29

## 2017-08-05 HISTORY — DX: Cerebral infarction, unspecified: I63.9

## 2017-08-05 LAB — CBC WITH DIFFERENTIAL/PLATELET
Basophils Absolute: 0 10*3/uL (ref 0–0.1)
Basophils Relative: 1 %
Eosinophils Absolute: 0.2 10*3/uL (ref 0–0.7)
Eosinophils Relative: 3 %
HCT: 51.7 % (ref 40.0–52.0)
Hemoglobin: 17.7 g/dL (ref 13.0–18.0)
Lymphocytes Relative: 30 %
Lymphs Abs: 1.8 10*3/uL (ref 1.0–3.6)
MCH: 32.5 pg (ref 26.0–34.0)
MCHC: 34.2 g/dL (ref 32.0–36.0)
MCV: 94.8 fL (ref 80.0–100.0)
Monocytes Absolute: 0.5 10*3/uL (ref 0.2–1.0)
Monocytes Relative: 9 %
Neutro Abs: 3.6 10*3/uL (ref 1.4–6.5)
Neutrophils Relative %: 57 %
Platelets: 195 10*3/uL (ref 150–440)
RBC: 5.45 MIL/uL (ref 4.40–5.90)
RDW: 13.1 % (ref 11.5–14.5)
WBC: 6.2 10*3/uL (ref 3.8–10.6)

## 2017-08-05 LAB — BASIC METABOLIC PANEL
Anion gap: 9 (ref 5–15)
BUN: 14 mg/dL (ref 6–20)
CO2: 25 mmol/L (ref 22–32)
Calcium: 9.4 mg/dL (ref 8.9–10.3)
Chloride: 104 mmol/L (ref 101–111)
Creatinine, Ser: 1.11 mg/dL (ref 0.61–1.24)
GFR calc Af Amer: >60 mL/min (ref >60)
GFR calc non Af Amer: >60 mL/min (ref >60)
Glucose, Bld: 92 mg/dL (ref 65–99)
Potassium: 4.2 mmol/L (ref 3.5–5.1)
Sodium: 138 mmol/L (ref 135–145)

## 2017-08-05 LAB — PROTIME-INR
INR: 0.98
Prothrombin Time: 12.9 seconds (ref 11.4–15.2)

## 2017-08-05 LAB — APTT: aPTT: 25 seconds (ref 24–36)

## 2017-08-05 MED ORDER — CEFAZOLIN SODIUM-DEXTROSE 2-4 GM/100ML-% IV SOLN
2.0000 g | INTRAVENOUS | Status: AC
  Administered 2017-08-06: 2 g via INTRAVENOUS

## 2017-08-06 ENCOUNTER — Inpatient Hospital Stay
Admission: RE | Admit: 2017-08-06 | Discharge: 2017-08-07 | DRG: 035 | Disposition: A | Discharge status: Home / Self Care | Payer: Medicare Other | Source: Ambulatory Visit | Attending: Vascular Surgery | Admitting: Vascular Surgery

## 2017-08-06 ENCOUNTER — Encounter
Admission: RE | Disposition: A | Discharge status: Home / Self Care | Payer: Self-pay | Source: Ambulatory Visit | Attending: Vascular Surgery

## 2017-08-06 ENCOUNTER — Encounter: Payer: Self-pay | Admitting: *Deleted

## 2017-08-06 DIAGNOSIS — I82419 Acute embolism and thrombosis of unspecified femoral vein: Secondary | ICD-10-CM | POA: Diagnosis present

## 2017-08-06 DIAGNOSIS — I2510 Atherosclerotic heart disease of native coronary artery without angina pectoris: Secondary | ICD-10-CM | POA: Diagnosis present

## 2017-08-06 DIAGNOSIS — Z8673 Personal history of transient ischemic attack (TIA), and cerebral infarction without residual deficits: Secondary | ICD-10-CM

## 2017-08-06 DIAGNOSIS — Z833 Family history of diabetes mellitus: Secondary | ICD-10-CM | POA: Diagnosis not present

## 2017-08-06 DIAGNOSIS — I6522 Occlusion and stenosis of left carotid artery: Principal | ICD-10-CM | POA: Diagnosis present

## 2017-08-06 DIAGNOSIS — I10 Essential (primary) hypertension: Secondary | ICD-10-CM | POA: Diagnosis present

## 2017-08-06 DIAGNOSIS — K5790 Diverticulosis of intestine, part unspecified, without perforation or abscess without bleeding: Secondary | ICD-10-CM | POA: Diagnosis present

## 2017-08-06 DIAGNOSIS — D6851 Activated protein C resistance: Secondary | ICD-10-CM | POA: Diagnosis present

## 2017-08-06 DIAGNOSIS — I82439 Acute embolism and thrombosis of unspecified popliteal vein: Secondary | ICD-10-CM | POA: Diagnosis present

## 2017-08-06 DIAGNOSIS — E785 Hyperlipidemia, unspecified: Secondary | ICD-10-CM | POA: Diagnosis present

## 2017-08-06 DIAGNOSIS — Z7982 Long term (current) use of aspirin: Secondary | ICD-10-CM

## 2017-08-06 DIAGNOSIS — Z8249 Family history of ischemic heart disease and other diseases of the circulatory system: Secondary | ICD-10-CM

## 2017-08-06 DIAGNOSIS — I6529 Occlusion and stenosis of unspecified carotid artery: Secondary | ICD-10-CM | POA: Diagnosis present

## 2017-08-06 HISTORY — PX: CAROTID PTA/STENT INTERVENTION: CATH118231

## 2017-08-06 LAB — MRSA PCR SCREENING: MRSA by PCR: NEGATIVE

## 2017-08-06 SURGERY — CAROTID PTA/STENT INTERVENTION
Anesthesia: Moderate Sedation | Laterality: Left

## 2017-08-06 MED ORDER — HEPARIN SODIUM (PORCINE) 1000 UNIT/ML IJ SOLN
INTRAMUSCULAR | Status: DC | PRN
  Administered 2017-08-06: 6000 [IU] via INTRAVENOUS
  Administered 2017-08-06: 3000 [IU] via INTRAVENOUS

## 2017-08-06 MED ORDER — LABETALOL HCL 5 MG/ML IV SOLN: 10.0000 mg | INTRAVENOUS | Status: DC | PRN

## 2017-08-06 MED ORDER — SODIUM CHLORIDE 0.9 % IV SOLN
INTRAVENOUS | Status: AC | PRN
  Administered 2017-08-06: 500 mL via INTRAVENOUS

## 2017-08-06 MED ORDER — CEFAZOLIN SODIUM-DEXTROSE 2-4 GM/100ML-% IV SOLN
INTRAVENOUS | Status: AC
  Filled 2017-08-06: qty 100

## 2017-08-06 MED ORDER — DOCUSATE SODIUM 100 MG PO CAPS
100.0000 mg | ORAL_CAPSULE | Freq: Every day | ORAL | Status: DC
  Administered 2017-08-07: 100 mg via ORAL
  Filled 2017-08-06: qty 1

## 2017-08-06 MED ORDER — ATORVASTATIN CALCIUM 20 MG PO TABS
40.0000 mg | ORAL_TABLET | Freq: Every day | ORAL | Status: DC
  Administered 2017-08-07: 40 mg via ORAL
  Filled 2017-08-06: qty 1
  Filled 2017-08-06 (×2): qty 2

## 2017-08-06 MED ORDER — ACETAMINOPHEN 325 MG PO TABS
325.0000 mg | ORAL_TABLET | ORAL | Status: DC | PRN
  Administered 2017-08-06 – 2017-08-07 (×2): 650 mg via ORAL
  Filled 2017-08-06 (×2): qty 2

## 2017-08-06 MED ORDER — HEPARIN (PORCINE) IN NACL 2-0.9 UNIT/ML-% IJ SOLN
INTRAMUSCULAR | Status: AC
  Filled 2017-08-06: qty 1000

## 2017-08-06 MED ORDER — ALUM & MAG HYDROXIDE-SIMETH 200-200-20 MG/5ML PO SUSP
15.0000 mL | ORAL | Status: DC | PRN
  Filled 2017-08-06: qty 30

## 2017-08-06 MED ORDER — ACETAMINOPHEN 325 MG RE SUPP
325.0000 mg | RECTAL | Status: DC | PRN
  Filled 2017-08-06: qty 2

## 2017-08-06 MED ORDER — MIDAZOLAM HCL 2 MG/2ML IJ SOLN
INTRAMUSCULAR | Status: DC | PRN
  Administered 2017-08-06 (×2): 1 mg via INTRAVENOUS
  Administered 2017-08-06: 2 mg via INTRAVENOUS

## 2017-08-06 MED ORDER — DOPAMINE-DEXTROSE 3.2-5 MG/ML-% IV SOLN
INTRAVENOUS | Status: AC
  Filled 2017-08-06: qty 250

## 2017-08-06 MED ORDER — PHENYLEPHRINE HCL 10 MG/ML IJ SOLN
INTRAMUSCULAR | Status: AC
  Filled 2017-08-06: qty 1

## 2017-08-06 MED ORDER — PHENOL 1.4 % MT LIQD
1.0000 | OROMUCOSAL | Status: DC | PRN
  Filled 2017-08-06: qty 177

## 2017-08-06 MED ORDER — GUAIFENESIN-DM 100-10 MG/5ML PO SYRP
15.0000 mL | ORAL_SOLUTION | ORAL | Status: DC | PRN
  Filled 2017-08-06: qty 15

## 2017-08-06 MED ORDER — FENTANYL CITRATE (PF) 100 MCG/2ML IJ SOLN
INTRAMUSCULAR | Status: AC
  Filled 2017-08-06: qty 2

## 2017-08-06 MED ORDER — IOPAMIDOL (ISOVUE-300) INJECTION 61%
INTRAVENOUS | Status: DC | PRN
  Administered 2017-08-06: 50 mL via INTRA_ARTERIAL

## 2017-08-06 MED ORDER — APIXABAN 5 MG PO TABS
5.0000 mg | ORAL_TABLET | Freq: Two times a day (BID) | ORAL | Status: DC
  Administered 2017-08-06 – 2017-08-07 (×2): 5 mg via ORAL
  Filled 2017-08-06 (×4): qty 1

## 2017-08-06 MED ORDER — DOPAMINE-DEXTROSE 3.2-5 MG/ML-% IV SOLN
0.0000 ug/kg/min | INTRAVENOUS | Status: DC
Start: 2017-08-06 — End: 2017-08-07
  Administered 2017-08-06: 4 ug/kg/min via INTRAVENOUS

## 2017-08-06 MED ORDER — CHLORHEXIDINE GLUCONATE CLOTH 2 % EX PADS: 6.0000 | MEDICATED_PAD | Freq: Once | CUTANEOUS | Status: DC

## 2017-08-06 MED ORDER — CLOPIDOGREL BISULFATE 300 MG PO TABS
300.0000 mg | ORAL_TABLET | ORAL | Status: AC
  Administered 2017-08-06: 300 mg via ORAL
  Filled 2017-08-06: qty 1

## 2017-08-06 MED ORDER — PANTOPRAZOLE SODIUM 40 MG IV SOLR
40.0000 mg | Freq: Every day | INTRAVENOUS | Status: DC
  Administered 2017-08-06: 40 mg via INTRAVENOUS
  Filled 2017-08-06: qty 40

## 2017-08-06 MED ORDER — OXYCODONE HCL 5 MG PO TABS: 5.0000 mg | ORAL_TABLET | ORAL | Status: DC | PRN

## 2017-08-06 MED ORDER — ATROPINE SULFATE 1 MG/10ML IJ SOSY
PREFILLED_SYRINGE | INTRAMUSCULAR | Status: DC | PRN
  Administered 2017-08-06: 0.5 mg via INTRAVENOUS

## 2017-08-06 MED ORDER — LIDOCAINE HCL (PF) 1 % IJ SOLN
INTRAMUSCULAR | Status: AC
  Filled 2017-08-06: qty 30

## 2017-08-06 MED ORDER — SODIUM CHLORIDE 0.9 % IV SOLN: 500.0000 mL | Freq: Once | INTRAVENOUS | Status: DC | PRN

## 2017-08-06 MED ORDER — MORPHINE SULFATE (PF) 2 MG/ML IV SOLN
INTRAVENOUS | Status: AC
  Filled 2017-08-06: qty 2

## 2017-08-06 MED ORDER — FENTANYL CITRATE (PF) 100 MCG/2ML IJ SOLN
INTRAMUSCULAR | Status: DC | PRN
  Administered 2017-08-06 (×3): 50 ug via INTRAVENOUS

## 2017-08-06 MED ORDER — ASPIRIN 81 MG PO TBEC: 81.0000 mg | DELAYED_RELEASE_TABLET | Freq: Every day | ORAL | Status: DC

## 2017-08-06 MED ORDER — DEXTROSE 5 % IV SOLN
1.5000 g | Freq: Two times a day (BID) | INTRAVENOUS | Status: AC
  Administered 2017-08-06 (×2): 1.5 g via INTRAVENOUS
  Filled 2017-08-06 (×2): qty 1.5

## 2017-08-06 MED ORDER — ONDANSETRON HCL 4 MG/2ML IJ SOLN
4.0000 mg | Freq: Four times a day (QID) | INTRAMUSCULAR | Status: DC | PRN
  Administered 2017-08-06 (×2): 4 mg via INTRAVENOUS
  Filled 2017-08-06: qty 2

## 2017-08-06 MED ORDER — HEPARIN SODIUM (PORCINE) 1000 UNIT/ML IJ SOLN
INTRAMUSCULAR | Status: AC
  Filled 2017-08-06: qty 1

## 2017-08-06 MED ORDER — MIDAZOLAM HCL 5 MG/5ML IJ SOLN
INTRAMUSCULAR | Status: AC
  Filled 2017-08-06: qty 5

## 2017-08-06 MED ORDER — ONDANSETRON HCL 4 MG/2ML IJ SOLN
INTRAMUSCULAR | Status: AC
  Filled 2017-08-06: qty 2

## 2017-08-06 MED ORDER — ATROPINE SULFATE 1 MG/10ML IJ SOSY
PREFILLED_SYRINGE | INTRAMUSCULAR | Status: AC
  Filled 2017-08-06: qty 10

## 2017-08-06 MED ORDER — SODIUM CHLORIDE 0.9 % IV SOLN
INTRAVENOUS | Status: DC
  Administered 2017-08-06: 10:00:00 via INTRAVENOUS

## 2017-08-06 MED ORDER — MORPHINE SULFATE (PF) 4 MG/ML IV SOLN
2.0000 mg | INTRAVENOUS | Status: DC | PRN
  Administered 2017-08-06: 4 mg via INTRAVENOUS

## 2017-08-06 MED ORDER — ASPIRIN EC 81 MG PO TBEC
81.0000 mg | DELAYED_RELEASE_TABLET | Freq: Every day | ORAL | Status: DC
  Administered 2017-08-07: 81 mg via ORAL
  Filled 2017-08-06 (×2): qty 1

## 2017-08-06 MED ORDER — METOPROLOL TARTRATE 5 MG/5ML IV SOLN: 2.0000 mg | INTRAVENOUS | Status: DC | PRN

## 2017-08-06 MED ORDER — CLOPIDOGREL BISULFATE 75 MG PO TABS
75.0000 mg | ORAL_TABLET | Freq: Every day | ORAL | Status: DC
  Administered 2017-08-07: 75 mg via ORAL
  Filled 2017-08-06: qty 1

## 2017-08-06 MED ORDER — KCL IN DEXTROSE-NACL 20-5-0.9 MEQ/L-%-% IV SOLN
INTRAVENOUS | Status: DC
  Administered 2017-08-06 – 2017-08-07 (×2): via INTRAVENOUS
  Filled 2017-08-06 (×4): qty 1000

## 2017-08-06 MED ORDER — HYDRALAZINE HCL 20 MG/ML IJ SOLN: 5.0000 mg | INTRAMUSCULAR | Status: DC | PRN

## 2017-08-06 MED ORDER — APIXABAN 5 MG PO TABS
5.0000 mg | ORAL_TABLET | ORAL | Status: AC
  Administered 2017-08-06: 5 mg via ORAL

## 2017-08-06 SURGICAL SUPPLY — 17 items
BALLN VIATRAC 5X20X135 (BALLOONS) ×3
BALLOON VIATRAC 5X20X135 (BALLOONS) ×1 IMPLANT
CATH ANGIO 5F 100CM .035 PIG (CATHETERS) ×3 IMPLANT
CATH BEACON 5 .038 100 JB1 TIP (CATHETERS) ×3 IMPLANT
DEVICE EMBOSHIELD NAV6 4.0-7.0 (WIRE) ×3 IMPLANT
DEVICE PRESTO INFLATION (MISCELLANEOUS) ×3 IMPLANT
DEVICE STARCLOSE SE CLOSURE (Vascular Products) ×3 IMPLANT
DEVICE TORQUE (MISCELLANEOUS) ×3 IMPLANT
GUIDEWIRE LT ZIPWIRE 035X260 (WIRE) ×3 IMPLANT
KIT CAROTID MANIFOLD (MISCELLANEOUS) ×3 IMPLANT
NEEDLE ENTRY 21GA 7CM ECHOTIP (NEEDLE) ×3 IMPLANT
SET INTRO CAPELLA COAXIAL (SET/KITS/TRAYS/PACK) ×3 IMPLANT
SHEATH BRITE TIP 6FRX11 (SHEATH) ×3 IMPLANT
SHEATH SHUTTLE 6FRX80 (SHEATH) ×3 IMPLANT
STENT XACT CAR 9-7X40X136 (Permanent Stent) ×3 IMPLANT
WIRE G VAS 035X260 STIFF (WIRE) ×3 IMPLANT
WIRE J 3MM .035X145CM (WIRE) ×3 IMPLANT

## 2017-08-06 NOTE — H&P (Signed)
[facility] History & Physical Update  The patient was interviewed and re-examined.  The patient's previous History and Physical has been reviewed and is unchanged.  There is no change in the plan of care. We plan to proceed with the scheduled procedure.  Levora DredgeGregory Djibril Glogowski, MD  08/06/2017, 9:22 AM

## 2017-08-06 NOTE — Progress Notes (Signed)
RN spoke with Dr. Gilda CreaseSchnier over the phone to clarify eliquis and aspirin care order.  RN made MD aware that per patient he took aspirin this morning and that he was instructed not to start his eliquis back until tomorrow.  MD stated "give him a one time dose now of 5 mg of eliquis and he needs to get the 2200 dose of eliquis tonight also and he doesn't have to take anymore aspirin today."

## 2017-08-07 ENCOUNTER — Other Ambulatory Visit: Payer: Self-pay

## 2017-08-07 LAB — CBC
HCT: 41.4 % (ref 40.0–52.0)
Hemoglobin: 14.2 g/dL (ref 13.0–18.0)
MCH: 32.3 pg (ref 26.0–34.0)
MCHC: 34.2 g/dL (ref 32.0–36.0)
MCV: 94.4 fL (ref 80.0–100.0)
Platelets: 169 10*3/uL (ref 150–440)
RBC: 4.39 MIL/uL — ABNORMAL LOW (ref 4.40–5.90)
RDW: 12.9 % (ref 11.5–14.5)
WBC: 7.4 10*3/uL (ref 3.8–10.6)

## 2017-08-07 LAB — POCT ACTIVATED CLOTTING TIME: Activated Clotting Time: 230 seconds

## 2017-08-07 LAB — BASIC METABOLIC PANEL
ANION GAP: 6 (ref 5–15)
BUN: 14 mg/dL (ref 6–20)
CALCIUM: 8.3 mg/dL — AB (ref 8.9–10.3)
CO2: 23 mmol/L (ref 22–32)
CREATININE: 1.16 mg/dL (ref 0.61–1.24)
Chloride: 109 mmol/L (ref 101–111)
GFR calc Af Amer: >60 mL/min (ref >60)
GFR calc non Af Amer: >60 mL/min (ref >60)
GLUCOSE: 132 mg/dL — AB (ref 65–99)
Potassium: 4.4 mmol/L (ref 3.5–5.1)
Sodium: 138 mmol/L (ref 135–145)

## 2017-08-07 MED ORDER — PANTOPRAZOLE SODIUM 40 MG PO TBEC: 40.0000 mg | DELAYED_RELEASE_TABLET | Freq: Every day | ORAL | Status: DC

## 2017-08-07 MED ORDER — PROMETHAZINE HCL 25 MG/ML IJ SOLN: 6.2500 mg | Freq: Four times a day (QID) | INTRAMUSCULAR | Status: DC | PRN

## 2017-08-07 MED ORDER — CLOPIDOGREL BISULFATE 75 MG PO TABS: 75.0000 mg | ORAL_TABLET | Freq: Every day | ORAL | 1 refills | Status: DC

## 2017-08-07 NOTE — Progress Notes (Signed)
PHARMACIST - PHYSICIAN COMMUNICATION  CONCERNING: IV to Oral Route Change Policy  RECOMMENDATION: This patient is receiving pantoprazole by the intravenous route.  Based on criteria approved by the Pharmacy and Therapeutics Committee, the intravenous medication(s) is/are being converted to the equivalent oral dose form(s).   DESCRIPTION: These criteria include:  The patient is eating (either orally or via tube) and/or has been taking other orally administered medications for a least 24 hours  The patient has no evidence of active gastrointestinal bleeding or impaired GI absorption (gastrectomy, short bowel, patient on TNA or NPO).  If you have questions about this conversion, please contact the Pharmacy Department  []   [phone]( [phone] )  [facility] [x]   [phone]( [phone] )  [facility] []   [phone]( [phone] )  [facility] []   [phone]( [phone] )  [facility] []   [phone]( [phone] )  [facility]   Adolphe Fortunato L, [facility] 08/07/2017 3:10 PM

## 2017-08-07 NOTE — Plan of Care (Signed)
  Progressing Education: Knowledge of General Education information will improve 08/07/2017 0525 - Progressing by Hollice Espyoberson, Armanii Pressnell V, RN Health Behavior/Discharge Planning: Ability to manage health-related needs will improve 08/07/2017 0525 - Progressing by Hollice Espyoberson, Quinlan Vollmer V, RN Clinical Measurements: Ability to maintain clinical measurements within normal limits will improve 08/07/2017 0525 - Progressing by Hollice Espyoberson, Weslee Fogg V, RN Will remain free from infection 08/07/2017 0525 - Progressing by Hollice Espyoberson, Shiraz Bastyr V, RN Diagnostic test results will improve 08/07/2017 0525 - Progressing by Hollice Espyoberson, Nashla Althoff V, RN Respiratory complications will improve 08/07/2017 0525 - Progressing by Hollice Espyoberson, Davontay Watlington V, RN Cardiovascular complication will be avoided 08/07/2017 0525 - Progressing by Hollice Espyoberson, Taye Cato V, RN Activity: Risk for activity intolerance will decrease 08/07/2017 0525 - Progressing by Hollice Espyoberson, Aubrielle Stroud V, RN Nutrition: Adequate nutrition will be maintained 08/07/2017 0525 - Progressing by Hollice Espyoberson, Colonel Krauser V, RN Coping: Level of anxiety will decrease 08/07/2017 0525 - Progressing by Hollice Espyoberson, Cameran Pettey V, RN Elimination: Will not experience complications related to bowel motility 08/07/2017 0525 - Progressing by Hollice Espyoberson, Atif Chapple V, RN Will not experience complications related to urinary retention 08/07/2017 0525 - Progressing by Hollice Espyoberson, Kentavius Dettore V, RN Pain Managment: General experience of comfort will improve 08/07/2017 0525 - Progressing by Hollice Espyoberson, Mayan Kloepfer V, RN Safety: Ability to remain free from injury will improve 08/07/2017 0525 - Progressing by Hollice Espyoberson, Habiba Treloar V, RN Skin Integrity: Risk for impaired skin integrity will decrease 08/07/2017 0525 - Progressing by Hollice Espyoberson, Kendel Bessey V, RN

## 2017-08-07 NOTE — Progress Notes (Signed)
Patient alert and oriented. No complaints of pain except for his left shoulder- He states that its been like that for a while (rotator cuff injury). Patient tolerating regular diet, using commode, ambulating in room with no complications. PAD removed this am with no bleeding, some ecchymosis. Pedal pulses intact. Patient states that he will schedule his follow up visits. Went over discharge instructions and patient had no further questions. Removed IV's and helped patient get dressed. Per Dr Gilda CreaseSchnier patient can be discharged home. Tramadol prescription given to patient. Patient is also aware to stop taking metoprolol and schedule a follow up with his primary care doctor.

## 2017-08-07 NOTE — Discharge Summary (Signed)
[facility]    Discharge Summary    Patient ID:  Bryan BirchLarry A Hodsdon Sr. MRN: [id number] DOB/AGE: 12-22-1950 67 y.o.  Admit date: 08/06/2017 Discharge date: 08/07/2017 Date of Surgery: 08/06/2017 Surgeon: Surgeon(s): Schnier, Latina CraverGregory G, MD  Admission Diagnosis: LT Carotid Stent   Carotid artery stenosis   ABBOTT rep needed   ANESTHESIA   Leah notified per Vernona RiegerLaura cc:  M Godley  S Wiley  Discharge Diagnoses:  LT Carotid Stent   Carotid artery stenosis   ABBOTT rep needed   ANESTHESIA   Leah notified per Vernona RiegerLaura cc:  M Godley  S Wiley  Secondary Diagnoses: Past Medical History:  Diagnosis Date  . Carotid artery stenosis   . Diverticulosis   . Factor V Leiden (HCC)   . Hyperlipidemia   . Plantar fasciitis   . Stroke [facility](HCC)     Procedure(s): CAROTID PTA/STENT INTERVENTION  Discharged Condition: good  HPI:  Patient is s/p successful treatment of his carotid stenosis  Hospital Course:  Bryan BirchLarry A Petitfrere Sr. is a 67 y.o. male is S/P Left Procedure(s): CAROTID PTA/STENT INTERVENTION Extubated: POD # 0 Physical exam: neuro intact Post-op wounds clean, dry, intact or healing well Pt. Ambulating, voiding and taking PO diet without difficulty. Pt pain controlled with PO pain meds. Labs as below Complications:none  Consults:    Significant Diagnostic Studies: CBC Lab Results  Component Value Date   WBC 7.4 08/07/2017   HGB 14.2 08/07/2017   HCT 41.4 08/07/2017   MCV 94.4 08/07/2017   PLT 169 08/07/2017    BMET    Component Value Date/Time   NA 138 08/07/2017 0504   K 4.4 08/07/2017 0504   CL 109 08/07/2017 0504   CO2 23 08/07/2017 0504   GLUCOSE 132 (H) 08/07/2017 0504   BUN 14 08/07/2017 0504   CREATININE 1.16 08/07/2017 0504   CALCIUM 8.3 (L) 08/07/2017 0504   GFRNONAA >60 08/07/2017 0504   GFRAA >60 08/07/2017 0504   COAG Lab Results  Component Value Date   INR 0.98 08/05/2017   INR 0.83 07/18/2017     Disposition:   Discharge to :Home Discharge Instructions    Call MD for:  redness, tenderness, or signs of infection (pain, swelling, bleeding, redness, odor or green/yellow discharge around incision site)   Complete by:  As directed    Call MD for:  severe or increased pain, loss or decreased feeling  in affected limb(s)   Complete by:  As directed    Call MD for:  temperature >100.5   Complete by:  As directed    Discharge instructions   Complete by:  As directed    OK to shower   Driving Restrictions   Complete by:  As directed    No driving for  One week   Resume previous diet   Complete by:  As directed      Allergies as of 08/07/2017   No Known Allergies     Medication List    STOP taking these medications   metoprolol succinate 25 MG 24 hr tablet Commonly known as:  TOPROL XL     TAKE these medications   apixaban 5 MG Tabs tablet Commonly known as:  ELIQUIS Take 1 tablet (5 mg total) by mouth 2 (two) times daily.   aspirin 81 MG EC tablet Take 1 tablet (81 mg total) by mouth daily.   atorvastatin 40 MG tablet Commonly known as:  LIPITOR Take 1 tablet (40 mg total) by  mouth daily.   clopidogrel 75 MG tablet Commonly known as:  PLAVIX Take 1 tablet (75 mg total) by mouth daily. Start taking on:  08/08/2017      Verbal and written Discharge instructions given to the patient. Wound care per Discharge AVS Follow-up Information    Schnier, Latina Craver, MD Follow up in 10 day(s).   Specialties:  Vascular Surgery, Cardiology, Radiology, Vascular Surgery Why:  carotid duplex Contact information: [address] [place] [address] [phone]           Signed: Levora Dredge, MD  08/07/2017, 5:24 PM

## 2017-08-07 NOTE — Discharge Instructions (Signed)
Carotid Angioplasty With Stent, Care After °These instructions give you information about caring for yourself after your procedure. Your doctor may also give you more specific instructions. Call your doctor if you have any problems or questions after your procedure. °Follow these instructions at home: °Medicines °· Take over-the-counter and prescription medicines only as told by your doctor. °· If you were prescribed an antibiotic medicine, take it as told by your doctor. Do not stop taking the antibiotic even if you start to feel better. °Caring for Your Cut from Surgery ° °· Keep your cut clean and dry. °· Follow instructions from your doctor about how to take care of your cut from surgery (incision). Make sure you: °? Wash your hands with soap and water before you change your bandage (dressing). If you cannot use soap and water, use hand sanitizer. °? Change your bandage as told by your doctor. °? Leave stitches (sutures), skin glue, or skin tape (adhesive) strips in place. They may need to stay in place for 2 weeks or longer. If tape strips get loose and curl up, you may trim the loose edges. Do not remove tape strips completely unless your doctor says it is okay. °· Check the area around your cut every day for signs of infection. Check for: °? More redness, swelling, or pain. °? More fluid or blood. °? Warmth. °? Pus or a bad smell. °Activity °· Return to your normal activities as told by your doctor. Ask your doctor what activities are safe for you. °· Do not lift anything that is heavier than 10 lb (4.5 kg) until your doctor says it is okay. °· Avoid sexual activity until your doctor says that this is safe for you. °· Exercise regularly, as told by your doctor. Ask your doctor what types of exercise are safe for you. °Eating and drinking ° °· Follow instructions from your doctor about what you cannot eat or drink. °· Drink enough fluid to keep your pee (urine) clear or pale yellow. °· Eat a heart-healthy  diet. This should include lots of fresh fruits and vegetables. Meat should be lean cuts. Avoid foods that are: °? High in salt, saturated fat, or sugar. °? Canned or highly processed. °? Fried. °Lifestyle °· Limit alcohol intake to no more than 1 drink per day for nonpregnant women and 2 drinks per day for men. One drink equals 12 oz of beer, 5 oz of wine, or 1½ oz of hard liquor. °· Do not use any tobacco products, such as cigarettes, chewing tobacco, or e-cigarettes. If you need help quitting, ask your doctor. °· Work with your doctor to keep your blood pressure under control. °· Stay at a healthy weight. °General instructions °· Do not drive or use heavy machinery while taking prescription pain medicine. °· Do not take baths, swim, or use a hot tub until your doctor approves. °· Tell all doctors who care for you that you have a stent. °· Keep all follow-up visits as told by your doctor. This is important. °Contact a doctor if: °· You have more redness, swelling, or pain around your cut. °· You have more fluid or blood coming from your cut. °· The area around your cut feels warm to the touch. °· You have pus or a bad smell coming from your cut. °· You have a lump caused by bleeding under your skin (hematoma), and the lump does not go away after 2 weeks. °· You have a fever. °Get help right away if: °·   You have vision changes or loss of vision.  You have numbness or weakness on one side of your body.  You have trouble talking.  You have slurred speech or you cannot speak (aphasia).  You suddenly feel very confused.  You notice a lump caused by bleeding under your skin and the lump is quickly getting larger (expanding).  You suddenly get pain in the area where your stent was placed.  Your cut from surgery starts to bleed and does not stop after you hold pressure on it for 30 minutes. These symptoms may be an emergency. Do not wait to see if the symptoms will go away. Get help right away. Call your  local emergency services (911 in [place] Do not drive yourself to the hospital. This information is not intended to replace advice given to you by your health care provider. Make sure you discuss any questions you have with your health care provider. Document Released: 07/13/2013 Document Revised: 12/14/2015 Document Reviewed: 04/02/2015 Elsevier Interactive Patient Education  2018 [employer]  Information on my medicine - ELIQUIS (apixaban)  This medication education was reviewed with me or my healthcare representative as part of my discharge preparation.  The pharmacist that spoke with me during my hospital stay was:  Inna Tisdell L, RPH  Why was Eliquis prescribed for you? Eliquis was prescribed for you to reduce the risk of forming blood clots that can cause a stroke if you have a medical condition called atrial fibrillation (a type of irregular heartbeat) OR to reduce the risk of a blood clots forming after orthopedic surgery.  What do You need to know about Eliquis ? Take your Eliquis TWICE DAILY - one tablet in the morning and one tablet in the evening with or without food.  It would be best to take the doses about the same time each day.  If you have difficulty swallowing the tablet whole please discuss with your pharmacist how to take the medication safely.  Take Eliquis exactly as prescribed by your doctor and DO NOT stop taking Eliquis without talking to the doctor who prescribed the medication.  Stopping may increase your risk of developing a new clot or stroke.  Refill your prescription before you run out.  After discharge, you should have regular check-up appointments with your healthcare provider that is prescribing your Eliquis.  In the future your dose may need to be changed if your kidney function or weight changes by a significant amount or as you get older.  What do you do if you miss a dose? If you miss a dose, take it as soon as you remember on the same day  and resume taking twice daily.  Do not take more than one dose of ELIQUIS at the same time.  Important Safety Information A possible side effect of Eliquis is bleeding. You should call your healthcare provider right away if you experience any of the following: ? Bleeding from an injury or your nose that does not stop. ? Unusual colored urine (red or dark brown) or unusual colored stools (red or black). ? Unusual bruising for unknown reasons. ? A serious fall or if you hit your head (even if there is no bleeding).  Some medicines may interact with Eliquis and might increase your risk of bleeding or clotting while on Eliquis. To help avoid this, consult your healthcare provider or pharmacist prior to using any new prescription or non-prescription medications, including herbals, vitamins, non-steroidal anti-inflammatory drugs (NSAIDs) and supplements.  This website has  more information on Eliquis (apixaban): [email or web address].no.

## 2017-08-07 NOTE — Progress Notes (Signed)
Per Dr Gilda CreaseSchnier removed PAD. Patient up into chair per Schnier eating regular breakfast. Dopamine turned off this am at 7:00.

## 2017-08-07 NOTE — Op Note (Signed)
OPERATIVE NOTE DATE: 08/06/2017  PROCEDURE: 1.  Ultrasound guidance for vascular access right femoral artery 2.  Placement of a 9 mm x 40mm Exact stent with the use of the NAV-6 embolic protection device in the left carotid artery  PRE-OPERATIVE DIAGNOSIS: 1. Left carotid artery stenosis. 2. Left hemispheric CVA; Acute DVT  POST-OPERATIVE DIAGNOSIS:  Same as above  SURGEON:  Renford DillsGregory G Alwyn Cordner, MD  ASSISTANT(S):  Festus BarrenJason Dew, MD  ANESTHESIA: local/MCS  ESTIMATED BLOOD LOSS:  125 cc  CONTRAST: 60 cc  FLUORO TIME: 15 minutes  MODERATE CONSCIOUS SEDATION TIME:  Approximately 53 minutes using  Versed and Fentanyl  FINDING(S): 1.   90% carotid artery stenosis  SPECIMEN(S):   none  INDICATIONS:   Patient is a 67 y.o. male who presents with recent left sided CVA and 90% left ICA stenosis.  The patient has Acute DVT and carotid artery stenting was felt to be preferred to endarterectomy for that reason.  Risks and benefits were discussed and informed consent was obtained.   DESCRIPTION: After obtaining full informed written consent, the patient was brought back to the vascular suite and placed supine upon the table.  The patient received IV antibiotics prior to induction. Moderate conscious sedation was administered during a face to face encounter with the patient throughout the procedure with my supervision of the RN administering medicines and monitoring the patients vital signs and mental status throughout from the start of the procedure until the patient was taken to the recovery room.  After obtaining adequate anesthesia, the patient was prepped and draped in the standard fashion.   The right femoral artery was visualized with ultrasound and found to be widely patent. It was then accessed under direct ultrasound guidance without difficulty with a Seldinger needle. A permanent image was recorded. A J-wire was placed and we then placed a 6 French sheath. The patient was then  heparinized and a total of 8000 units of intravenous heparin were given and an ACT was checked to confirm successful anticoagulation. A pigtail catheter was then placed into the ascending aorta. This showed a Type two Arch. I then selectively cannulated the left common carotid without difficulty with a JB-1 catheter and advanced into the mid left common carotid artery.  Cervical and cerebral carotid angiography was then performed. There were no obvious intracranial filling defects with lateral and Water's views. The carotid bifurcation demonstrated 90% stenosis at the origin of the left ICA.  I then advanced into the external carotid artery with a Glidewire and the JB-1 catheter and then exchanged for the Amplatz Super Stiff wire. Over the Amplatz Super Stiff wire, a 6 [place]French shuttle sheath was placed into the mid common carotid artery. I then used the NAV-6  Embolic protection device and crossed the lesion and parked this in the distal internal carotid artery at the base of the skull.  I then selected a 9 mm x 40 mm Exact stent. This was deployed across the lesion encompassing it in its entirety. A 5 mm x20 mm length balloon was used to post dilate the stent. Only about a 15% residual stenosis was present after angioplasty. Completion angiogram showed normal intracranial filling without new defects. At this point I elected to terminate the procedure. The sheath was removed and StarClose closure device was deployed in the right femoral artery with excellent hemostatic result. The patient was taken to the recovery room in stable condition having tolerated the procedure well.    COMPLICATIONS: none  CONDITION:  stable  Levora Dredge 08/07/2017 3:08 PM   This note was created with Dragon Medical transcription system. Any errors in dictation are purely unintentional.

## 2017-08-08 ENCOUNTER — Encounter: Payer: Self-pay | Admitting: Vascular Surgery

## 2017-08-13 ENCOUNTER — Telehealth (INDEPENDENT_AMBULATORY_CARE_PROVIDER_SITE_OTHER): Payer: Self-pay | Admitting: Vascular Surgery

## 2017-08-13 NOTE — Telephone Encounter (Signed)
Called the patient back to let him know of the information that was advised.

## 2017-08-13 NOTE — Telephone Encounter (Signed)
The patient underwent a carotid stent placement.  A closure device is used to close the artery when the Dr. is done with the endovascular case.  This can be felt particularly in thin people.  If this hard lump becomes red, painful or starts to drain or any other signs of infection then the patient should call the office.

## 2017-08-13 NOTE — Telephone Encounter (Signed)
New Message  Pt verbalized he has a hard area near his groin area and his leg. Pt is unsure if it is something he needs to be concerned about.  Pt verbalized he had stint put in last Wednesday also.  I scheduled pt an appt with GS on 1.28.19 @ 930.

## 2017-08-17 NOTE — Progress Notes (Signed)
MRN : [id number]  Bryan MIKAMI Sr. is a 67 y.o. (1951-01-06) male who presents with chief complaint of No chief complaint on file. [name]  History of Present Illness:  DATE: 08/06/2017 1.  Ultrasound guidance for vascular access right femoral artery 2.  Placement of a 9 mm x 40mm Exact stent with the use of the NAV-6 embolic protection device in the left carotid artery  The patient is seen for follow up evaluation of carotid stenosis status post left carotid stenting on 08/06/2017.  There were no post operative problems or complications related to the surgery.  The patient denies neck pain.  He is concerned about a painful lump in his right groin.  The patient denies interval amaurosis fugax. There is no recent history of TIA symptoms or focal motor deficits. There is no prior documented CVA.  The patient denies headache.  The patient is taking enteric-coated aspirin 81 mg daily.  The patient has a history of coronary artery disease, no recent episodes of angina or shortness of breath. The patient denies PAD or claudication symptoms. There is a history of hyperlipidemia which is being treated with a statin.      No outpatient medications have been marked as taking for the 08/18/17 encounter (Appointment) with Gilda Crease, Latina Craver, MD.    Past Medical History:  Diagnosis Date  . Carotid artery stenosis   . Diverticulosis   . Factor V Leiden (HCC)   . Hyperlipidemia   . Plantar fasciitis   . Stroke [facility])     Past Surgical History:  Procedure Laterality Date  . CAROTID PTA/STENT INTERVENTION Left 08/06/2017   Procedure: CAROTID PTA/STENT INTERVENTION;  Surgeon: Renford Dills, MD;  Location: [facility] INVASIVE CV LAB;  Service: Cardiovascular;  Laterality: Left;  . COLONOSCOPY WITH PROPOFOL N/A 03/17/2015   Procedure: COLONOSCOPY WITH PROPOFOL;  Surgeon: Christena Deem, MD;  Location: [facility] ENDOSCOPY;  Service: Endoscopy;  Laterality: N/A;  . VASECTOMY      Social  History Social History   Tobacco Use  . Smoking status: Never Smoker  . Smokeless tobacco: Former Engineer, water Use Topics  . Alcohol use: Not on file    Comment: occasional  . Drug use: No    Family History Family History  Problem Relation Age of Onset  . CAD Mother   . Diabetes Mother   . CAD Father   . CAD Sister     No Known Allergies   REVIEW OF SYSTEMS (Negative unless checked)  Constitutional: [] Weight loss  [] Fever  [] Chills Cardiac: [] Chest pain   [] Chest pressure   [] Palpitations   [] Shortness of breath when laying flat   [] Shortness of breath with exertion. Vascular:  [] Pain in legs with walking   [] Pain in legs at rest  [] History of DVT   [] Phlebitis   [] Swelling in legs   [] Varicose veins   [] Non-healing ulcers Pulmonary:   [] Uses home oxygen   [] Productive cough   [] Hemoptysis   [] Wheeze  [] COPD   [] Asthma Neurologic:  [] Dizziness   [] Seizures   [] History of stroke   [] History of TIA  [] Aphasia   [] Vissual changes   [] Weakness or numbness in arm   [] Weakness or numbness in leg Musculoskeletal:   [] Joint swelling   [] Joint pain   [] Low back pain Hematologic:  [] Easy bruising  [] Easy bleeding   [] Hypercoagulable state   [] Anemic Gastrointestinal:  [] Diarrhea   [] Vomiting  [] Gastroesophageal reflux/heartburn   [] Difficulty swallowing. Genitourinary:  [] Chronic kidney disease   []   Difficult urination  [] Frequent urination   [] Blood in urine Skin:  [] Rashes   [] Ulcers  Psychological:  [] History of anxiety   []  History of major depression.  Physical Examination  There were no vitals filed for this visit. There is no height or weight on file to calculate BMI. Gen: WD/WN, NAD Head: [place]/AT, No temporalis wasting.  Ear/Nose/Throat: Hearing grossly intact, nares w/o erythema or drainage Eyes: PER, EOMI, sclera nonicteric.  Neck: Supple, no large masses.   Pulmonary:  Good air movement, no audible wheezing bilaterally, no use of accessory muscles.  Cardiac: RRR, no  JVD Vascular: small hematoma right groin Vessel Right Left  Radial Palpable Palpable  PT Palpable Palpable  DP Palpable Palpable  Gastrointestinal: Non-distended. No guarding/no peritoneal signs.  Musculoskeletal: M/S 5/5 throughout.  No deformity or atrophy.  Neurologic: CN 2-12 intact. Symmetrical.  Speech is fluent. Motor exam as listed above. Psychiatric: Judgment intact, Mood & affect appropriate for pt's clinical situation. Dermatologic: No rashes or ulcers noted.  No changes consistent with cellulitis. Lymph : No lichenification or skin changes of chronic lymphedema.  CBC Lab Results  Component Value Date   WBC 7.4 08/07/2017   HGB 14.2 08/07/2017   HCT 41.4 08/07/2017   MCV 94.4 08/07/2017   PLT 169 08/07/2017    BMET    Component Value Date/Time   NA 138 08/07/2017 0504   K 4.4 08/07/2017 0504   CL 109 08/07/2017 0504   CO2 23 08/07/2017 0504   GLUCOSE 132 (H) 08/07/2017 0504   BUN 14 08/07/2017 0504   CREATININE 1.16 08/07/2017 0504   CALCIUM 8.3 (L) 08/07/2017 0504   GFRNONAA >60 08/07/2017 0504   GFRAA >60 08/07/2017 0504   Estimated Creatinine Clearance: 65.2 mL/min (by C-G formula based on SCr of 1.16 mg/dL).  COAG Lab Results  Component Value Date   INR 0.98 08/05/2017   INR 0.83 07/18/2017    Radiology Ct Angio Neck W Or Wo Contrast  Result Date: 07/20/2017 CLINICAL DATA:  Bilateral carotid bifurcation stenosis. Left watershed territory infarcts. Factor V Leiden deficiency. EXAM: CT ANGIOGRAPHY NECK TECHNIQUE: Multidetector CT imaging of the neck was performed using the standard protocol during bolus administration of intravenous contrast. Multiplanar CT image reconstructions and MIPs were obtained to evaluate the vascular anatomy. Carotid stenosis measurements (when applicable) are obtained utilizing NASCET criteria, using the distal internal carotid diameter as the denominator. CONTRAST:  75mL ISOVUE-370 IOPAMIDOL (ISOVUE-370) INJECTION 76%  COMPARISON:  MRI brain and MRA head and neck in 07/18/2017. FINDINGS: Aortic arch: The a 3 vessel arch configuration is present. There is minimal calcification along the underside of the arch. No significant stenosis or aneurysm is present. Right carotid system: The right common carotid artery is within normal limits. A high-grade, near occlusive, stenosis is present at the origin of the right internal carotid artery. The lumen is not measurable. The more distal right ICA is within normal limits. Focal calcification is present in the petrous segment, just above the skullbase. There is no significant stenosis at that level. Left carotid system: The left common carotid artery is within normal limits. Dense calcifications and soft tissue plaque are present at the bifurcation. There is a high-grade, near occlusive stenosis of the left internal carotid artery with extensive soft plaque extending superiorly over 10 mm. No significant distal stenosis is present. The distal right internal carotid artery is slightly more narrow than on the left. Additional calcifications are present within the petrous segment without a significant tandem stenosis.  Vertebral arteries: The vertebral artery is originate from the subclavian artery is without significant stenosis. Right vertebral artery is slightly dominant to the right. There is no significant stenosis in the neck. Vertebrobasilar junction is normal. PICA origins are visualized and normal bilaterally. Skeleton: Dextroconvex curvature is present in the cervical spine with asymmetric uncovertebral disease on the left at C3-4 and C4-5 in particular. Vertebral body heights are maintained. 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 Other neck: The soft tissues of the neck are otherwise unremarkable. The thyroid is within normal limits. There is no significant cervical adenopathy. Upper chest: The lung apices are clear. IMPRESSION: 1. Severe, near occlusive stenosis of the proximal internal  carotid artery is bilaterally. 2. Soft tissue or fibrotic plaque more extensive left than right. This may represent acute soft plaque within the lumen on the left distal to the high-grade stenosis. 3. Slight decreased caliber of the more distal left internal carotid artery compared to the right suggests a greater hemodynamic effect on the left, corresponding to the site of the patient's recent watershed type infarctions. These results were called by telephone at the time of interpretation on 07/20/2017 at 11:53 am to Drs. RICHARD WIETING and Durene Cal , who verbally acknowledged these results. Electronically Signed   By: Marin Roberts M.D.   On: 07/20/2017 12:10   Mr Maxine Glenn Head Wo Contrast  Result Date: 07/18/2017 CLINICAL DATA:  Right arm weakness EXAM: MRI HEAD WITHOUT CONTRAST MRA HEAD WITHOUT CONTRAST MRA NECK WITHOUT CONTRAST TECHNIQUE: Multiplanar, multiecho pulse sequences of the brain and surrounding structures were obtained without intravenous contrast. Angiographic images of the Circle of Willis were obtained using MRA technique without intravenous contrast. Angiographic images of the neck were obtained using MRA technique without intravenous contrast. Carotid stenosis measurements (when applicable) are obtained utilizing NASCET criteria, using the distal internal carotid diameter as the denominator. COMPARISON:  Head CT 07/18/2017 FINDINGS: MRI HEAD FINDINGS Brain: The midline structures are normal. There is multifocal hyperintensity on diffusion-weighted imaging in the left hemisphere, along the margins between the anterior and middle cerebral arteries and margin between the middle and posterior cerebral arteries. ADC map shows varying degrees of corresponding diffusion restriction. There is corresponding hyperintense T2-weighted signal. Otherwise, the brain parenchyma is normal. No mass lesion. No chronic microhemorrhage or cerebral amyloid angiopathy. No hydrocephalus, age advanced  atrophy or lobar predominant volume loss. No dural abnormality or extra-axial collection. Skull and upper cervical spine: The visualized skull base, calvarium, upper cervical spine and extracranial soft tissues are normal. Sinuses/Orbits: No fluid levels or advanced mucosal thickening. No mastoid effusion. Normal orbits. MRA HEAD FINDINGS Intracranial internal carotid arteries: Normal. Anterior cerebral arteries: Normal. Middle cerebral arteries: Normal. Posterior communicating arteries: Present on the right. Posterior cerebral arteries: Normal. Basilar artery: Normal. Vertebral arteries: Left dominant. Normal. Superior cerebellar arteries: Normal. Anterior inferior cerebellar arteries: Poorly visualized. Posterior inferior cerebellar arteries: Normal. MRA NECK FINDINGS There is diminished flow related enhancement within both proximal internal carotid arteries with approximately 75% stenosis. Both vertebral arteries are normal caliber along their entire visualized length. There is a 3 vessel aortic arch branching pattern. IMPRESSION: 1. Numerous foci of acute ischemia within the left hemisphere along the left ACA-MCA and MCA-PCA watershed zones. The pattern is most consistent with a hypotensive/hypoperfusion infarct. 2. No hemorrhage or mass effect. 3. No emergent large vessel occlusion. 4. 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 Confirmation with CT angiography of the neck or carotid ultrasound is  recommended, as this study is prone to artifacts. Electronically Signed   By: Deatra Robinson M.D.   On: 07/18/2017 21:22   Mr Maxine Glenn Neck Wo Contrast  Result Date: 07/18/2017 CLINICAL DATA:  Right arm weakness EXAM: MRI HEAD WITHOUT CONTRAST MRA HEAD WITHOUT CONTRAST MRA NECK WITHOUT CONTRAST TECHNIQUE: Multiplanar, multiecho pulse sequences of the brain and surrounding structures were obtained without intravenous contrast. Angiographic images of the Circle  of Willis were obtained using MRA technique without intravenous contrast. Angiographic images of the neck were obtained using MRA technique without intravenous contrast. Carotid stenosis measurements (when applicable) are obtained utilizing NASCET criteria, using the distal internal carotid diameter as the denominator. COMPARISON:  Head CT 07/18/2017 FINDINGS: MRI HEAD FINDINGS Brain: The midline structures are normal. There is multifocal hyperintensity on diffusion-weighted imaging in the left hemisphere, along the margins between the anterior and middle cerebral arteries and margin between the middle and posterior cerebral arteries. ADC map shows varying degrees of corresponding diffusion restriction. There is corresponding hyperintense T2-weighted signal. Otherwise, the brain parenchyma is normal. No mass lesion. No chronic microhemorrhage or cerebral amyloid angiopathy. No hydrocephalus, age advanced atrophy or lobar predominant volume loss. No dural abnormality or extra-axial collection. Skull and upper cervical spine: The visualized skull base, calvarium, upper cervical spine and extracranial soft tissues are normal. Sinuses/Orbits: No fluid levels or advanced mucosal thickening. No mastoid effusion. Normal orbits. MRA HEAD FINDINGS Intracranial internal carotid arteries: Normal. Anterior cerebral arteries: Normal. Middle cerebral arteries: Normal. Posterior communicating arteries: Present on the right. Posterior cerebral arteries: Normal. Basilar artery: Normal. Vertebral arteries: Left dominant. Normal. Superior cerebellar arteries: Normal. Anterior inferior cerebellar arteries: Poorly visualized. Posterior inferior cerebellar arteries: Normal. MRA NECK FINDINGS There is diminished flow related enhancement within both proximal internal carotid arteries with approximately 75% stenosis. Both vertebral arteries are normal caliber along their entire visualized length. There is a 3 vessel aortic arch branching  pattern. IMPRESSION: 1. Numerous foci of acute ischemia within the left hemisphere along the left ACA-MCA and MCA-PCA watershed zones. The pattern is most consistent with a hypotensive/hypoperfusion infarct. 2. No hemorrhage or mass effect. 3. No emergent large vessel occlusion. 4. 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 Confirmation with CT angiography of the neck or carotid ultrasound is recommended, as this study is prone to artifacts. Electronically Signed   By: Deatra Robinson M.D.   On: 07/18/2017 21:22   Mr Brain Wo Contrast  Result Date: 07/18/2017 CLINICAL DATA:  Right arm weakness EXAM: MRI HEAD WITHOUT CONTRAST MRA HEAD WITHOUT CONTRAST MRA NECK WITHOUT CONTRAST TECHNIQUE: Multiplanar, multiecho pulse sequences of the brain and surrounding structures were obtained without intravenous contrast. Angiographic images of the Circle of Willis were obtained using MRA technique without intravenous contrast. Angiographic images of the neck were obtained using MRA technique without intravenous contrast. Carotid stenosis measurements (when applicable) are obtained utilizing NASCET criteria, using the distal internal carotid diameter as the denominator. COMPARISON:  Head CT 07/18/2017 FINDINGS: MRI HEAD FINDINGS Brain: The midline structures are normal. There is multifocal hyperintensity on diffusion-weighted imaging in the left hemisphere, along the margins between the anterior and middle cerebral arteries and margin between the middle and posterior cerebral arteries. ADC map shows varying degrees of corresponding diffusion restriction. There is corresponding hyperintense T2-weighted signal. Otherwise, the brain parenchyma is normal. No mass lesion. No chronic microhemorrhage or cerebral amyloid angiopathy. No hydrocephalus, age advanced atrophy or lobar predominant volume loss. No dural abnormality or  extra-axial collection. Skull and upper  cervical spine: The visualized skull base, calvarium, upper cervical spine and extracranial soft tissues are normal. Sinuses/Orbits: No fluid levels or advanced mucosal thickening. No mastoid effusion. Normal orbits. MRA HEAD FINDINGS Intracranial internal carotid arteries: Normal. Anterior cerebral arteries: Normal. Middle cerebral arteries: Normal. Posterior communicating arteries: Present on the right. Posterior cerebral arteries: Normal. Basilar artery: Normal. Vertebral arteries: Left dominant. Normal. Superior cerebellar arteries: Normal. Anterior inferior cerebellar arteries: Poorly visualized. Posterior inferior cerebellar arteries: Normal. MRA NECK FINDINGS There is diminished flow related enhancement within both proximal internal carotid arteries with approximately 75% stenosis. Both vertebral arteries are normal caliber along their entire visualized length. There is a 3 vessel aortic arch branching pattern. IMPRESSION: 1. Numerous foci of acute ischemia within the left hemisphere along the left ACA-MCA and MCA-PCA watershed zones. The pattern is most consistent with a hypotensive/hypoperfusion infarct. 2. No hemorrhage or mass effect. 3. No emergent large vessel occlusion. 4. 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 Confirmation with CT angiography of the neck or carotid ultrasound is recommended, as this study is prone to artifacts. Electronically Signed   By: Deatra Robinson M.D.   On: 07/18/2017 21:22   US Venous Img Lower Bilateral  Result Date: 07/19/2017 CLINICAL DATA:  67 year old male with a history of swelling EXAM: BILATERAL LOWER EXTREMITY VENOUS DOPPLER ULTRASOUND TECHNIQUE: Gray-scale sonography with graded compression, as well as color Doppler and duplex ultrasound were performed to evaluate the lower extremity deep venous systems from the level of the common femoral vein and including the common femoral, femoral, profunda  femoral, popliteal and calf veins including the posterior tibial, peroneal and gastrocnemius veins when visible. The superficial great saphenous vein was also interrogated. Spectral Doppler was utilized to evaluate flow at rest and with distal augmentation maneuvers in the common femoral, femoral and popliteal veins. COMPARISON:  None. FINDINGS: RIGHT LOWER EXTREMITY Common Femoral Vein: No evidence of thrombus. Normal compressibility, respiratory phasicity and response to augmentation. Saphenofemoral Junction: No evidence of thrombus. Normal compressibility and flow on color Doppler imaging. Profunda Femoral Vein: No evidence of thrombus. Normal compressibility and flow on color Doppler imaging. Femoral Vein: No evidence of thrombus. Normal compressibility, respiratory phasicity and response to augmentation. Popliteal Vein: No evidence of thrombus. Normal compressibility, respiratory phasicity and response to augmentation. Calf Veins: No evidence of thrombus. Normal compressibility and flow on color Doppler imaging. Superficial Great Saphenous Vein: No evidence of thrombus. Normal compressibility and flow on color Doppler imaging. Venous Reflux:  None. Other Findings:  None. LEFT LOWER EXTREMITY Occlusive thrombus involves the common femoral vein and femoral vein extending to the popliteal vein. Popliteal vein is patent. There is a paired femoral vein which remains patent in the thigh. Popliteal vein remains patent with compressible vein and flow maintained. Posterior tibial veins and peroneal veins are patent with no thrombus. Superficial Great Saphenous Vein: No evidence of thrombus. Normal compressibility and flow on color Doppler imaging. Other Findings:  None. IMPRESSION: Sonographic survey of the left lower extremity positive for acute DVT of the common femoral vein and 1 of the paired femoral veins. Sonographic survey of the right lower extremity negative for acute DVT. There is a paired left-sided femoral  vein which remains patent. These results were called by by the completing technologist at the time of interpretation on 07/19/2017 at 4:23 pm to Dr. Alford Highland. Signed, Yvone Neu. Loreta Ave, DO [facility] Electronically Signed  By: Gilmer Mor D.O.   On: 07/19/2017 16:24    Assessment/Plan 1. Stenosis of left carotid artery Patient is stable status post left carotid stenting.  He will follow-up in 1 month with a duplex ultrasound right carotid can be assessed at that time as to whether it meets the criteria for intervention - VAS US CAROTID; Future  2. Acute deep vein thrombosis (DVT) of femoral vein, unspecified laterality (HCC) Continue Eliquis  3. Factor V Leiden (HCC) Lifelong anticoagulation  4. Symptomatic stenosis of both carotid arteries We will plan follow-up in 4 weeks to assess whether the right side needs to be treated at this time    Levora Dredge, MD  08/17/2017 5:32 PM

## 2017-08-18 ENCOUNTER — Encounter (INDEPENDENT_AMBULATORY_CARE_PROVIDER_SITE_OTHER): Payer: Self-pay | Admitting: Vascular Surgery

## 2017-08-18 ENCOUNTER — Ambulatory Visit (INDEPENDENT_AMBULATORY_CARE_PROVIDER_SITE_OTHER): Payer: Medicare Other | Admitting: Vascular Surgery

## 2017-08-18 VITALS — BP 130/89 | HR 68 | Resp 17 | Ht 65.0 in | Wt 191.0 lb

## 2017-08-18 DIAGNOSIS — I82419 Acute embolism and thrombosis of unspecified femoral vein: Secondary | ICD-10-CM

## 2017-08-18 DIAGNOSIS — D6851 Activated protein C resistance: Secondary | ICD-10-CM

## 2017-08-18 DIAGNOSIS — I6523 Occlusion and stenosis of bilateral carotid arteries: Secondary | ICD-10-CM

## 2017-08-18 DIAGNOSIS — I6522 Occlusion and stenosis of left carotid artery: Secondary | ICD-10-CM

## 2017-08-20 ENCOUNTER — Encounter (INDEPENDENT_AMBULATORY_CARE_PROVIDER_SITE_OTHER): Payer: Self-pay | Admitting: Vascular Surgery

## 2017-08-24 ENCOUNTER — Other Ambulatory Visit (INDEPENDENT_AMBULATORY_CARE_PROVIDER_SITE_OTHER): Payer: Self-pay | Admitting: Vascular Surgery

## 2017-08-25 ENCOUNTER — Other Ambulatory Visit (INDEPENDENT_AMBULATORY_CARE_PROVIDER_SITE_OTHER): Payer: Self-pay

## 2017-08-25 MED ORDER — APIXABAN 5 MG PO TABS: 5.0000 mg | ORAL_TABLET | Freq: Two times a day (BID) | ORAL | 2 refills | Status: DC

## 2017-09-22 ENCOUNTER — Encounter (INDEPENDENT_AMBULATORY_CARE_PROVIDER_SITE_OTHER): Payer: Self-pay | Admitting: Vascular Surgery

## 2017-09-22 ENCOUNTER — Ambulatory Visit (INDEPENDENT_AMBULATORY_CARE_PROVIDER_SITE_OTHER): Payer: Medicare Other | Admitting: Vascular Surgery

## 2017-09-22 ENCOUNTER — Ambulatory Visit (INDEPENDENT_AMBULATORY_CARE_PROVIDER_SITE_OTHER): Payer: Medicare Other

## 2017-09-22 VITALS — BP 136/94 | HR 73 | Resp 17 | Ht 65.0 in | Wt 186.0 lb

## 2017-09-22 DIAGNOSIS — I6522 Occlusion and stenosis of left carotid artery: Secondary | ICD-10-CM | POA: Diagnosis not present

## 2017-09-22 DIAGNOSIS — I6523 Occlusion and stenosis of bilateral carotid arteries: Secondary | ICD-10-CM

## 2017-09-22 DIAGNOSIS — I63132 Cerebral infarction due to embolism of left carotid artery: Secondary | ICD-10-CM

## 2017-09-22 DIAGNOSIS — E785 Hyperlipidemia, unspecified: Secondary | ICD-10-CM

## 2017-09-22 NOTE — Progress Notes (Addendum)
Subjective:    Patient ID: Bryan BirchLarry A Bucio Sr., male    DOB: 1951-05-22, 67 y.o.   MRN: [id number] Chief Complaint  Patient presents with  . Follow-up    1 month Carotid   The patient presents for his first post procedure follow-up.  The patient is status post a left ICA carotid stent on August 06, 2017.  The patient's postprocedure course has been unremarkable.  The patient presents today without complaint. The patient denies experiencing Amaurosis Fugax, TIA like symptoms or focal motor deficits.  The patient underwent a bilateral carotid artery duplex which was notable for patent left carotid artery stent.  Right internal carotid artery consistent with 80-99% stenosis.  Bilateral vertebral arteries were patent with antegrade flow.  Normal flow dynamics were seen in the bilateral subclavian arteries.  The patient denies any fever, nausea or vomiting.  Patient with recent CTA of the neck on July 20, 2017.   Review of Systems  Constitutional: Negative.   HENT: Negative.   Eyes: Negative.   Respiratory: Negative.   Cardiovascular:       Carotid stenosis  Gastrointestinal: Negative.   Endocrine: Negative.   Genitourinary: Negative.   Musculoskeletal: Negative.   Skin: Negative.   Allergic/Immunologic: Negative.   Neurological: Negative.   Hematological: Negative.   Psychiatric/Behavioral: Negative.       Objective:   Physical Exam  Constitutional: He is oriented to person, place, and time. He appears well-developed and well-nourished. No distress.  HENT:  Head: Normocephalic.  Eyes: Conjunctivae are normal. Pupils are equal, round, and reactive to light.  Neck: Normal range of motion.  No carotid bruits noted to the bilateral neck  Cardiovascular: Normal rate, regular rhythm, normal heart sounds and intact distal pulses.  Pulses:      Radial pulses are 2+ on the right side, and 2+ on the left side.  Pulmonary/Chest: Effort normal and breath sounds normal. No respiratory  distress. He has no wheezes. He has no rales.  Abdominal: Soft. Bowel sounds are normal. He exhibits no distension. There is no tenderness. There is no rebound.  Musculoskeletal: He exhibits no edema.  Neurological: He is alert and oriented to person, place, and time.  Skin: Skin is warm and dry. He is not diaphoretic.  Psychiatric: He has a normal mood and affect. His behavior is normal. Judgment and thought content normal.  Vitals reviewed.  BP (!) 136/94 (BP Location: Left Arm)   Pulse 73   Resp 17   Ht 5\' 5"  (1.651 m)   Wt 186 lb (84.4 kg)   BMI 30.95 kg/m   Past Medical History:  Diagnosis Date  . Carotid artery stenosis   . Diverticulosis   . Factor V Leiden (HCC)   . Hyperlipidemia   . Plantar fasciitis   . Stroke [facility](HCC)    Social History   Socioeconomic History  . Marital status: Married    Spouse name: Not on file  . Number of children: Not on file  . Years of education: Not on file  . Highest education level: Not on file  Social Needs  . Financial resource strain: Not on file  . Food insecurity - worry: Not on file  . Food insecurity - inability: Not on file  . Transportation needs - medical: Not on file  . Transportation needs - non-medical: Not on file  Occupational History  . Not on file  Tobacco Use  . Smoking status: Never Smoker  . Smokeless tobacco: Former NeurosurgeonUser  Substance and Sexual Activity  . Alcohol use: Not on file    Comment: occasional  . Drug use: No  . Sexual activity: Not on file  Other Topics Concern  . Not on file  Social History Narrative  . Not on file   Past Surgical History:  Procedure Laterality Date  . CAROTID PTA/STENT INTERVENTION Left 08/06/2017   Procedure: CAROTID PTA/STENT INTERVENTION;  Surgeon: Renford Dills, MD;  Location: [facility] INVASIVE CV LAB;  Service: Cardiovascular;  Laterality: Left;  . COLONOSCOPY WITH PROPOFOL N/A 03/17/2015   Procedure: COLONOSCOPY WITH PROPOFOL;  Surgeon: Christena Deem, MD;  Location:  [facility] ENDOSCOPY;  Service: Endoscopy;  Laterality: N/A;  . VASECTOMY     Family History  Problem Relation Age of Onset  . CAD Mother   . Diabetes Mother   . CAD Father   . CAD Sister    No Known Allergies     Assessment & Plan:  The patient presents for his first post procedure follow-up.  The patient is status post a left ICA carotid stent on August 06, 2017.  The patient's postprocedure course has been unremarkable.  The patient presents today without complaint. The patient denies experiencing Amaurosis Fugax, TIA like symptoms or focal motor deficits.  The patient underwent a bilateral carotid artery duplex which was notable for patent left carotid artery stent.  Right internal carotid artery consistent with 80-99% stenosis.  Bilateral vertebral arteries were patent with antegrade flow.  Normal flow dynamics were seen in the bilateral subclavian arteries.  The patient denies any fever, nausea or vomiting.  Patient with recent CTA of the neck on July 20, 2017.  1. 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 Hyperlipidemia, unspecified hyperlipidemia type - Stable Encouraged good control as its slows the progression of atherosclerotic disease  3. Bilateral carotid artery stenosis - Stable The patient is status post left ICA stent placement on August 06, 2017 His postprocedure course has been unremarkable and a carotid duplex today is notable for a patent stent The patient will need to undergo repair of his right internal carotid artery due to critical stenosis of 80-99% I will review the patient's July 20, 2017 CTA of the neck with Dr. Gilda Crease to determine if open versus stent repair is appropriate The patient is aware that I will call him once I review these images with Dr. Hilliard Clark with Dr. Gilda Crease on September 25, 2017 and reviewed the patient's CTA of the neck.  We will move forward with a right carotid stent placement.  Current Outpatient Medications on File Prior to Visit  Medication Sig Dispense Refill  . aspirin EC 81 MG EC tablet Take 1 tablet (81 mg total) by mouth daily. 30 tablet 0  . atorvastatin (LIPITOR) 40 MG tablet Take 1 tablet (40 mg total) by mouth daily. 30 tablet 0  . clopidogrel (PLAVIX) 75 MG tablet Take 1 tablet (75 mg total) by mouth daily. 30 tablet 1  . traMADol (ULTRAM) 50 MG tablet Take by mouth every 6 (six) hours as needed.    . warfarin (COUMADIN) 5 MG tablet Take 5 mg by mouth once.     [name] apixaban (ELIQUIS) 5 MG TABS tablet Take 1 tablet (5 mg total) by mouth 2 (two) times daily. (Patient not taking: Reported on 09/22/2017) 60 tablet 2   No  current facility-administered medications on file prior to visit.    There are no Patient Instructions on file for this visit. No Follow-up on file.  KIMBERLY A STEGMAYER, PA-C

## 2017-09-25 ENCOUNTER — Encounter (INDEPENDENT_AMBULATORY_CARE_PROVIDER_SITE_OTHER): Payer: Self-pay

## 2017-09-30 ENCOUNTER — Other Ambulatory Visit (INDEPENDENT_AMBULATORY_CARE_PROVIDER_SITE_OTHER): Payer: Self-pay | Admitting: Vascular Surgery

## 2017-10-01 ENCOUNTER — Telehealth (INDEPENDENT_AMBULATORY_CARE_PROVIDER_SITE_OTHER): Payer: Self-pay

## 2017-10-01 NOTE — Telephone Encounter (Signed)
Returned the patient's call regarding having his carotid stent placement on 10/08/17 with Dr. Gilda CreaseSchnier. The patient stated his wife just had surgery and he was wondering if his procedure could be pushed out further. I advised that generally when I am given the information to schedule a patient it is usually sooner rather than later but the decision is his to make as to when he will have his procedure. He stated he would call back after discussing this with his wife.

## 2017-10-02 ENCOUNTER — Other Ambulatory Visit (INDEPENDENT_AMBULATORY_CARE_PROVIDER_SITE_OTHER): Payer: Self-pay | Admitting: Vascular Surgery

## 2017-10-06 ENCOUNTER — Encounter (INDEPENDENT_AMBULATORY_CARE_PROVIDER_SITE_OTHER): Payer: Self-pay

## 2017-10-07 ENCOUNTER — Inpatient Hospital Stay: Admission: RE | Admit: 2017-10-07 | Payer: Medicare Other | Source: Ambulatory Visit

## 2017-10-20 ENCOUNTER — Inpatient Hospital Stay: Admission: RE | Admit: 2017-10-20 | Payer: Medicare Other | Source: Ambulatory Visit

## 2017-10-20 ENCOUNTER — Encounter (INDEPENDENT_AMBULATORY_CARE_PROVIDER_SITE_OTHER): Payer: Self-pay

## 2017-10-27 ENCOUNTER — Other Ambulatory Visit (INDEPENDENT_AMBULATORY_CARE_PROVIDER_SITE_OTHER): Payer: Self-pay | Admitting: Vascular Surgery

## 2017-10-28 ENCOUNTER — Inpatient Hospital Stay
Admission: RE | Admit: 2017-10-28 | Discharge: 2017-10-28 | Disposition: A | Discharge status: Home / Self Care | Payer: Medicare Other | Source: Ambulatory Visit

## 2017-11-03 ENCOUNTER — Encounter (INDEPENDENT_AMBULATORY_CARE_PROVIDER_SITE_OTHER): Payer: Self-pay

## 2017-11-06 ENCOUNTER — Other Ambulatory Visit: Payer: Self-pay

## 2017-11-06 ENCOUNTER — Encounter
Admission: RE | Admit: 2017-11-06 | Discharge: 2017-11-06 | Disposition: A | Discharge status: Home / Self Care | Payer: Medicare Other | Source: Ambulatory Visit | Attending: Vascular Surgery | Admitting: Vascular Surgery

## 2017-11-06 DIAGNOSIS — Z01812 Encounter for preprocedural laboratory examination: Secondary | ICD-10-CM | POA: Diagnosis not present

## 2017-11-06 DIAGNOSIS — Z86718 Personal history of other venous thrombosis and embolism: Secondary | ICD-10-CM | POA: Insufficient documentation

## 2017-11-06 HISTORY — DX: Impingement syndrome of left shoulder: M75.42

## 2017-11-06 HISTORY — DX: Acute embolism and thrombosis of unspecified deep veins of unspecified lower extremity: I82.409

## 2017-11-06 LAB — PROTIME-INR
INR: 1.61
Prothrombin Time: 19 seconds — ABNORMAL HIGH (ref 11.4–15.2)

## 2017-11-06 LAB — CREATININE, SERUM
CREATININE: 0.99 mg/dL (ref 0.61–1.24)
GFR calc Af Amer: >60 mL/min (ref >60)
GFR calc non Af Amer: >60 mL/min (ref >60)

## 2017-11-06 LAB — BUN: BUN: 18 mg/dL (ref 6–20)

## 2017-11-11 MED ORDER — CEFAZOLIN SODIUM-DEXTROSE 2-4 GM/100ML-% IV SOLN
2.0000 g | Freq: Once | INTRAVENOUS | Status: AC
  Administered 2017-11-12: 2 g via INTRAVENOUS

## 2017-11-12 ENCOUNTER — Inpatient Hospital Stay
Admission: RE | Admit: 2017-11-12 | Discharge: 2017-11-13 | DRG: 035 | Disposition: A | Discharge status: Home / Self Care | Payer: Medicare Other | Source: Ambulatory Visit | Attending: Vascular Surgery | Admitting: Vascular Surgery

## 2017-11-12 ENCOUNTER — Other Ambulatory Visit: Payer: Self-pay

## 2017-11-12 ENCOUNTER — Encounter
Admission: RE | Disposition: A | Discharge status: Home / Self Care | Payer: Self-pay | Source: Ambulatory Visit | Attending: Vascular Surgery

## 2017-11-12 DIAGNOSIS — Z86718 Personal history of other venous thrombosis and embolism: Secondary | ICD-10-CM

## 2017-11-12 DIAGNOSIS — Z23 Encounter for immunization: Secondary | ICD-10-CM

## 2017-11-12 DIAGNOSIS — Z8673 Personal history of transient ischemic attack (TIA), and cerebral infarction without residual deficits: Secondary | ICD-10-CM

## 2017-11-12 DIAGNOSIS — I6521 Occlusion and stenosis of right carotid artery: Principal | ICD-10-CM | POA: Diagnosis present

## 2017-11-12 DIAGNOSIS — I2510 Atherosclerotic heart disease of native coronary artery without angina pectoris: Secondary | ICD-10-CM | POA: Diagnosis present

## 2017-11-12 DIAGNOSIS — D6859 Other primary thrombophilia: Secondary | ICD-10-CM | POA: Diagnosis present

## 2017-11-12 DIAGNOSIS — Z7901 Long term (current) use of anticoagulants: Secondary | ICD-10-CM

## 2017-11-12 DIAGNOSIS — D6851 Activated protein C resistance: Secondary | ICD-10-CM | POA: Diagnosis present

## 2017-11-12 DIAGNOSIS — I10 Essential (primary) hypertension: Secondary | ICD-10-CM | POA: Diagnosis present

## 2017-11-12 DIAGNOSIS — Z7982 Long term (current) use of aspirin: Secondary | ICD-10-CM

## 2017-11-12 DIAGNOSIS — I779 Disorder of arteries and arterioles, unspecified: Secondary | ICD-10-CM

## 2017-11-12 DIAGNOSIS — E785 Hyperlipidemia, unspecified: Secondary | ICD-10-CM | POA: Diagnosis present

## 2017-11-12 DIAGNOSIS — I739 Peripheral vascular disease, unspecified: Secondary | ICD-10-CM

## 2017-11-12 DIAGNOSIS — I6529 Occlusion and stenosis of unspecified carotid artery: Secondary | ICD-10-CM | POA: Diagnosis present

## 2017-11-12 HISTORY — PX: CAROTID PTA/STENT INTERVENTION: CATH118231

## 2017-11-12 LAB — GLUCOSE, CAPILLARY: GLUCOSE-CAPILLARY: 83 mg/dL (ref 65–99)

## 2017-11-12 LAB — MRSA PCR SCREENING: MRSA BY PCR: NEGATIVE

## 2017-11-12 LAB — HEMOGLOBIN AND HEMATOCRIT, BLOOD
HCT: 40 % (ref 40.0–52.0)
Hemoglobin: 13.8 g/dL (ref 13.0–18.0)

## 2017-11-12 LAB — POCT ACTIVATED CLOTTING TIME: Activated Clotting Time: 285 seconds

## 2017-11-12 SURGERY — CAROTID PTA/STENT INTERVENTION
Anesthesia: Moderate Sedation | Laterality: Right

## 2017-11-12 MED ORDER — MIDAZOLAM HCL 2 MG/2ML IJ SOLN
INTRAMUSCULAR | Status: DC | PRN
  Administered 2017-11-12: 1 mg via INTRAVENOUS
  Administered 2017-11-12: 2 mg via INTRAVENOUS

## 2017-11-12 MED ORDER — CEFAZOLIN SODIUM-DEXTROSE 2-4 GM/100ML-% IV SOLN
INTRAVENOUS | Status: AC
  Filled 2017-11-12: qty 100

## 2017-11-12 MED ORDER — GUAIFENESIN-DM 100-10 MG/5ML PO SYRP
15.0000 mL | ORAL_SOLUTION | ORAL | Status: DC | PRN
  Filled 2017-11-12: qty 15

## 2017-11-12 MED ORDER — ONDANSETRON HCL 4 MG/2ML IJ SOLN: 4.0000 mg | Freq: Four times a day (QID) | INTRAMUSCULAR | Status: DC | PRN

## 2017-11-12 MED ORDER — ATROPINE SULFATE 1 MG/10ML IJ SOSY
PREFILLED_SYRINGE | INTRAMUSCULAR | Status: AC
  Filled 2017-11-12: qty 10

## 2017-11-12 MED ORDER — METOPROLOL TARTRATE 5 MG/5ML IV SOLN: 2.0000 mg | INTRAVENOUS | Status: DC | PRN

## 2017-11-12 MED ORDER — FAMOTIDINE 20 MG PO TABS: 40.0000 mg | ORAL_TABLET | ORAL | Status: DC | PRN

## 2017-11-12 MED ORDER — CLOPIDOGREL BISULFATE 75 MG PO TABS
75.0000 mg | ORAL_TABLET | Freq: Every day | ORAL | Status: DC
  Administered 2017-11-13: 75 mg via ORAL
  Filled 2017-11-12: qty 1

## 2017-11-12 MED ORDER — ALUM & MAG HYDROXIDE-SIMETH 200-200-20 MG/5ML PO SUSP
15.0000 mL | ORAL | Status: DC | PRN
  Filled 2017-11-12: qty 30

## 2017-11-12 MED ORDER — HYDROMORPHONE HCL 1 MG/ML IJ SOLN: 1.0000 mg | Freq: Once | INTRAMUSCULAR | Status: DC | PRN

## 2017-11-12 MED ORDER — SODIUM CHLORIDE 0.9 % IV SOLN
INTRAVENOUS | Status: AC | PRN
  Administered 2017-11-12: 500 mL via INTRAVENOUS

## 2017-11-12 MED ORDER — ATROPINE SULFATE 0.4 MG/ML IJ SOLN
INTRAMUSCULAR | Status: DC | PRN
  Administered 2017-11-12: 1 mg via INTRAVENOUS

## 2017-11-12 MED ORDER — ASPIRIN EC 81 MG PO TBEC: 81.0000 mg | DELAYED_RELEASE_TABLET | Freq: Every day | ORAL | Status: DC

## 2017-11-12 MED ORDER — DOPAMINE-DEXTROSE 3.2-5 MG/ML-% IV SOLN
0.0000 ug/kg/min | INTRAVENOUS | Status: DC
  Administered 2017-11-12: 5 ug/kg/min via INTRAVENOUS
  Filled 2017-11-12: qty 250

## 2017-11-12 MED ORDER — SODIUM CHLORIDE 0.9 % IV SOLN
0.0000 ug/min | INTRAVENOUS | Status: DC
  Filled 2017-11-12: qty 1

## 2017-11-12 MED ORDER — ONDANSETRON HCL 4 MG/2ML IJ SOLN
4.0000 mg | Freq: Four times a day (QID) | INTRAMUSCULAR | Status: DC | PRN
  Administered 2017-11-12: 4 mg via INTRAVENOUS
  Filled 2017-11-12: qty 2

## 2017-11-12 MED ORDER — PHENOL 1.4 % MT LIQD
1.0000 | OROMUCOSAL | Status: DC | PRN
  Filled 2017-11-12: qty 177

## 2017-11-12 MED ORDER — PHENYLEPHRINE 40 MCG/ML (10ML) SYRINGE FOR IV PUSH (FOR BLOOD PRESSURE SUPPORT)
80.0000 ug | PREFILLED_SYRINGE | Freq: Once | INTRAVENOUS | Status: DC
  Filled 2017-11-12: qty 5

## 2017-11-12 MED ORDER — FENTANYL CITRATE (PF) 100 MCG/2ML IJ SOLN
INTRAMUSCULAR | Status: AC
  Filled 2017-11-12: qty 2

## 2017-11-12 MED ORDER — PNEUMOCOCCAL VAC POLYVALENT 25 MCG/0.5ML IJ INJ
0.5000 mL | INJECTION | INTRAMUSCULAR | Status: AC
  Administered 2017-11-13: 0.5 mL via INTRAMUSCULAR
  Filled 2017-11-12: qty 0.5

## 2017-11-12 MED ORDER — OXYCODONE HCL 5 MG PO TABS: 5.0000 mg | ORAL_TABLET | ORAL | Status: DC | PRN

## 2017-11-12 MED ORDER — PHENYLEPHRINE HCL 10 MG/ML IJ SOLN
INTRAMUSCULAR | Status: AC
  Filled 2017-11-12: qty 1

## 2017-11-12 MED ORDER — METHYLPREDNISOLONE SODIUM SUCC 125 MG IJ SOLR: 125.0000 mg | INTRAMUSCULAR | Status: DC | PRN

## 2017-11-12 MED ORDER — APIXABAN 5 MG PO TABS
5.0000 mg | ORAL_TABLET | Freq: Two times a day (BID) | ORAL | Status: DC
  Administered 2017-11-12: 5 mg via ORAL
  Filled 2017-11-12: qty 1

## 2017-11-12 MED ORDER — SODIUM CHLORIDE 0.9 % IV SOLN
INTRAVENOUS | Status: DC
  Administered 2017-11-12: 08:00:00 via INTRAVENOUS

## 2017-11-12 MED ORDER — DOCUSATE SODIUM 100 MG PO CAPS
100.0000 mg | ORAL_CAPSULE | Freq: Every day | ORAL | Status: DC
  Filled 2017-11-12: qty 1

## 2017-11-12 MED ORDER — ACETAMINOPHEN 325 MG RE SUPP
325.0000 mg | RECTAL | Status: DC | PRN
  Filled 2017-11-12: qty 2

## 2017-11-12 MED ORDER — HYDRALAZINE HCL 20 MG/ML IJ SOLN: 5.0000 mg | INTRAMUSCULAR | Status: DC | PRN

## 2017-11-12 MED ORDER — SODIUM CHLORIDE 0.9 % IV SOLN
500.0000 mL | Freq: Once | INTRAVENOUS | Status: AC | PRN
  Administered 2017-11-12: 500 mL via INTRAVENOUS

## 2017-11-12 MED ORDER — MORPHINE SULFATE (PF) 4 MG/ML IV SOLN: 2.0000 mg | INTRAVENOUS | Status: DC | PRN

## 2017-11-12 MED ORDER — MIDAZOLAM HCL 5 MG/5ML IJ SOLN
INTRAMUSCULAR | Status: AC
  Filled 2017-11-12: qty 5

## 2017-11-12 MED ORDER — WARFARIN SODIUM 5 MG PO TABS
5.0000 mg | ORAL_TABLET | Freq: Every day | ORAL | Status: DC
  Administered 2017-11-12: 5 mg via ORAL
  Filled 2017-11-12: qty 1

## 2017-11-12 MED ORDER — KCL IN DEXTROSE-NACL 20-5-0.9 MEQ/L-%-% IV SOLN
INTRAVENOUS | Status: DC
  Administered 2017-11-12: 13:00:00 via INTRAVENOUS
  Filled 2017-11-12 (×2): qty 1000

## 2017-11-12 MED ORDER — ATORVASTATIN CALCIUM 20 MG PO TABS
40.0000 mg | ORAL_TABLET | Freq: Every day | ORAL | Status: DC
  Administered 2017-11-12 – 2017-11-13 (×2): 40 mg via ORAL
  Filled 2017-11-12 (×2): qty 2

## 2017-11-12 MED ORDER — ASPIRIN EC 81 MG PO TBEC
81.0000 mg | DELAYED_RELEASE_TABLET | Freq: Every day | ORAL | Status: DC
  Administered 2017-11-13: 81 mg via ORAL
  Filled 2017-11-12: qty 1

## 2017-11-12 MED ORDER — FAMOTIDINE IN NACL 20-0.9 MG/50ML-% IV SOLN
20.0000 mg | Freq: Two times a day (BID) | INTRAVENOUS | Status: DC
  Administered 2017-11-12 (×2): 20 mg via INTRAVENOUS
  Filled 2017-11-12 (×2): qty 50

## 2017-11-12 MED ORDER — LABETALOL HCL 5 MG/ML IV SOLN: 10.0000 mg | INTRAVENOUS | Status: DC | PRN

## 2017-11-12 MED ORDER — HEPARIN SODIUM (PORCINE) 1000 UNIT/ML IJ SOLN
INTRAMUSCULAR | Status: DC | PRN
  Administered 2017-11-12: 7000 [IU] via INTRAVENOUS
  Administered 2017-11-12: 1000 [IU] via INTRAVENOUS

## 2017-11-12 MED ORDER — HEPARIN SODIUM (PORCINE) 1000 UNIT/ML IJ SOLN
INTRAMUSCULAR | Status: AC
  Filled 2017-11-12: qty 1

## 2017-11-12 MED ORDER — CEFAZOLIN SODIUM-DEXTROSE 2-4 GM/100ML-% IV SOLN
2.0000 g | Freq: Three times a day (TID) | INTRAVENOUS | Status: AC
  Administered 2017-11-12: 2 g via INTRAVENOUS
  Filled 2017-11-12 (×2): qty 100

## 2017-11-12 MED ORDER — SODIUM CHLORIDE 0.9 % IV SOLN
INTRAVENOUS | Status: DC
  Administered 2017-11-12: 15:00:00 via INTRAVENOUS
  Administered 2017-11-13: 125 mL/h via INTRAVENOUS

## 2017-11-12 MED ORDER — IOPAMIDOL (ISOVUE-300) INJECTION 61%
INTRAVENOUS | Status: DC | PRN
  Administered 2017-11-12: 48 mL via INTRA_ARTERIAL

## 2017-11-12 MED ORDER — WARFARIN - PHYSICIAN DOSING INPATIENT
Freq: Every day | Status: DC
  Administered 2017-11-12: 18:00:00

## 2017-11-12 MED ORDER — FENTANYL CITRATE (PF) 100 MCG/2ML IJ SOLN
INTRAMUSCULAR | Status: DC | PRN
  Administered 2017-11-12: 50 ug via INTRAVENOUS
  Administered 2017-11-12: 25 ug via INTRAVENOUS

## 2017-11-12 MED ORDER — HEPARIN (PORCINE) IN NACL 1000-0.9 UT/500ML-% IV SOLN
INTRAVENOUS | Status: AC
  Filled 2017-11-12: qty 1000

## 2017-11-12 MED ORDER — ACETAMINOPHEN 325 MG PO TABS
325.0000 mg | ORAL_TABLET | ORAL | Status: DC | PRN
  Administered 2017-11-12 – 2017-11-13 (×3): 650 mg via ORAL
  Filled 2017-11-12 (×3): qty 2

## 2017-11-12 SURGICAL SUPPLY — 21 items
BALLN VIATRAC 5X20X135 (BALLOONS) ×3
BALLOON VIATRAC 5X20X135 (BALLOONS) ×1 IMPLANT
CATH ANGIO 5F 100CM .035 PIG (CATHETERS) ×3 IMPLANT
CATH BEACON 5 .038 100 JB1 TIP (CATHETERS) ×3 IMPLANT
CATH BEACON 5 .038 100 VERT TP (CATHETERS) ×3 IMPLANT
DEVICE EMBOSHIELD NAV6 4.0-7.0 (FILTER) ×3 IMPLANT
DEVICE PRESTO INFLATION (MISCELLANEOUS) ×3 IMPLANT
DEVICE STARCLOSE SE CLOSURE (Vascular Products) ×3 IMPLANT
DEVICE TORQUE .025-.038 (MISCELLANEOUS) ×3 IMPLANT
GLIDEWIRE ANGLED SS 035X260CM (WIRE) ×3 IMPLANT
KIT CAROTID MANIFOLD (MISCELLANEOUS) ×3 IMPLANT
NEEDLE ENTRY 21GA 7CM ECHOTIP (NEEDLE) ×3 IMPLANT
PACK ANGIOGRAPHY (CUSTOM PROCEDURE TRAY) ×3 IMPLANT
SET INTRO CAPELLA COAXIAL (SET/KITS/TRAYS/PACK) ×3 IMPLANT
SHEATH BRITE TIP 6FRX11 (SHEATH) ×3 IMPLANT
SHEATH SHUTTLE SELECT 6F (SHEATH) ×3 IMPLANT
STENT XACT CAR 10-8X30X136 (Permanent Stent) ×3 IMPLANT
TUBING CONTRAST HIGH PRESS 72 (TUBING) ×3 IMPLANT
WIRE AMPLATZ SSTIFF .035X260CM (WIRE) ×3 IMPLANT
WIRE AQUATRACK .035X260CM (WIRE) ×3 IMPLANT
WIRE J 3MM .035X145CM (WIRE) ×3 IMPLANT

## 2017-11-12 NOTE — Progress Notes (Signed)
RN spoke with Dr. Gilda CreaseSchnier and explained to MD how patient was feeling since dopamine was started and that with titration from 2.5 to 5 mcg/kg/min that patients symptoms returned with nausea, dizziness and feeling hot in his head.  RN explained that his blood pressure responds well to dopamine.  Dr. Gilda CreaseSchnier stated "lets start it back at 3 mcg/kg/min and see how he does, just titrate it very slowly since he is very sensitive."

## 2017-11-12 NOTE — Progress Notes (Signed)
RN spoke with Dr. Gilda CreaseSchnier and made MD aware that patient is bleeding at groin site with PAD in place and that pressure is being held and plan to place new PAD.  RN also made MD aware that patient's blood pressure is staying in the low 70's systolically.  MD stated to apply new PAD and to obtain systolic blood pressure with doppler and manual cuff.

## 2017-11-12 NOTE — Op Note (Signed)
OPERATIVE NOTE DATE: 11/12/2017  PROCEDURE: 1.  Ultrasound guidance for vascular access right femoral artery 2.  Placement of a 10 x 8 x 30 exact stent with the use of the NAV-6 embolic protection device in the right internal carotid artery  PRE-OPERATIVE DIAGNOSIS: 1.  Symptomatic right internal carotid artery stenosis. 2.  TIA  POST-OPERATIVE DIAGNOSIS:  Same as above  SURGEON: Levora Dredge, MD  ASSISTANT(S):  Festus Barren, MD  ANESTHESIA: Conscious sedation was administered under my direct supervision by the interventional radiology RN.  IV Versed plus fentanyl were utilized. Continuous ECG, pulse oximetry and blood pressure was monitored throughout the entire procedure.  Conscious sedation was for a total of 50 minutes.   ESTIMATED BLOOD LOSS: 100 cc  CONTRAST: 48 cc  FLUORO TIME: 9.3 minutes  FINDING(S): 1.   95 percent right internal carotid artery stenosis, focal lesion moderately calcified  SPECIMEN(S):   none  INDICATIONS:   Patient is a 67 y.o. male who presents with symptomatic right internal carotid artery stenosis.  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  Risks and benefits were discussed and informed consent was obtained.   DESCRIPTION: After obtaining full informed written consent, the patient was brought back to the vascular suite and placed supine upon the table.  The patient received IV antibiotics prior to induction. Moderate conscious sedation was administered during a face to face encounter with the patient throughout the procedure with my supervision of the RN administering medicines and monitoring the patients vital signs and mental status throughout from the start of the procedure until the patient was taken to the recovery room.  After obtaining adequate anesthesia, the patient was prepped and draped in the  standard fashion.   The right femoral artery was visualized with ultrasound and found to be widely patent. It was then accessed under direct ultrasound guidance without difficulty with a Seldinger needle. A permanent image was recorded. A J-wire was placed and we then placed a 6 French sheath. The patient was then heparinized and a total of 8000 units of intravenous heparin were given and an ACT was checked to confirm successful anticoagulation. A pigtail catheter was then placed into the ascending aorta. This showed type I arch no evidence of hemodynamically significant ostial stenosis. I then selectively cannulated the right common carotid without difficulty with a KMP catheter and advanced into the mid right common carotid artery.  Cervical and cerebral carotid angiography was then performed. There were no obvious intracranial filling defects. The carotid bifurcation demonstrated 95% stenosis at the origin of the internal carotid on the right.  I then advanced into the external carotid artery with a Glidewire and the KMP catheter and then exchanged for the Amplatz Super Stiff wire. Over the Amplatz Super Stiff wire, a 6 [place] shuttle sheath was placed into the mid common carotid artery. I then used the large NAV-6  Embolic protection device and crossed the lesion and parked this in the distal internal carotid artery at the base of the skull.  I then selected a 10 x 8 x 30 exact stent. This was deployed across the lesion encompassing it in its entirety. A 5 mm x 20 mm length balloon was used to post dilate the stent. Only about a 25 % residual stenosis was present after angioplasty. Completion angiogram showed normal intracranial filling without new defects. At this point  I elected to terminate the procedure. The sheath was removed and StarClose closure device was deployed in the right femoral artery with excellent hemostatic result. The patient was taken to the recovery room in stable condition having tolerated  the procedure well.  COMPLICATIONS: none  CONDITION: stable  Levora DredgeGregory Ersel Enslin 11/12/2017 9:19 AM   This note was created with Dragon Medical transcription system. Any errors in dictation are purely unintentional.

## 2017-11-12 NOTE — Progress Notes (Addendum)
Dopamine started at 1510 and at 1514 patient complained of his head feeling hot, blurred vision and nausea.  RN paused dopamine and rechecked blood pressure and blood pressure 93/63 post dopamine starting and BP at 1500 prior to dopamine being started was 73/49.  Patient recovered and denies nausea, blurred vision and feeling hot therefore dopamine restarted at 2.425mcg/kg/min.  VS being taken q445min and RN sitting at bedside with patient to monitor closely.

## 2017-11-12 NOTE — Progress Notes (Signed)
Bryan Okaan, RN from vascular radiology came to assess need for epi injection at vascular site.  No bleeding noted to PAD and Bryan Okaan, RN called and spoke with Dr. Gilda CreaseSchnier.  Dr. Gilda CreaseSchnier gave order for PAD to stay inflated in place until tomorrow morning and for patient to lay flat for 4 hours.  MD stated patient could get up out of bed in the morning.

## 2017-11-12 NOTE — Progress Notes (Signed)
RN spoke with Dr. Gilda CreaseSchnier on the phone and made MD aware that systolic blood pressure dopplered and is 70.  RN also made MD aware that patient had a lot of bleeding from groin site that was underneath him and that pressure was held at site and new PAD applied.   MD gave order for H&H, to start dopamine drip and titrate for SBP >90 and to change IVF to NS @ 125 cc/H.  MD also stated he would send RN from vascular to inject epi at groin site.

## 2017-11-12 NOTE — H&P (Signed)
[facility] Admission History & Physical  MRN : [id number]  Bryan Holland Sr. is a 67 y.o. (03-28-1951) male who presents with chief complaint of No chief complaint on file. [name]  History of Present Illness:   The patient is seen for evaluation of carotid stenosis. The carotid stenosis was identified after he woke up with right arm weakness.  On initial evaluation he did note he had several episodes over the past few weeks.    Today he is noting some left facial numbness that has occurred intermittently.  No changes in his speech.  No changes in his arm or leg strength.  The patient was seen for follow up evaluation of carotid stenosis status post left carotid stenting on 08/06/2017.  DATE: 08/06/2017 1. Ultrasound guidance for vascular access rightfemoral artery 2. Placement of a 9 mm x stent with the use of the NAV-6 embolic protection device in the leftcarotid artery  The patient is a true left-handed individual  There is a prior documented CVA.  There is no history of migraine headaches. There is no history of seizures.  The patient is taking enteric-coated aspirin 81 mg daily.  Of significance the patient does have a history of factor V Leiden and does describe episodes of thrombophlebitis which occurred in the past some of what he states is consistent with a superficial thrombophlebitis but some of his description is also consistent with deep vein thrombosis as he was anticoagulated for a time.  During this admission he was noted to have swelling and pain of his left lower extremity and was found by ultrasound to have acute DVT.  It was elected to not anticoagulate him initially but given his stroke however on presentation to the office he has been started on Eliquis.  The patient has a history of coronary artery disease, no recent episodes of angina or shortness of breath. The patient denies PAD or claudication symptoms. There is a  history of hyperlipidemia which is being treated with a statin.      Current Facility-Administered Medications  Medication Dose Route Frequency Provider Last Rate Last Dose  . 0.9 %  sodium chloride infusion   Intravenous Continuous Stegmayer, Kimberly A, PA-C 75 mL/hr at 11/12/17 0744    . ceFAZolin (ANCEF) 2-4 GM/100ML-% IVPB           . ceFAZolin (ANCEF) IVPB 2g/100 mL premix  2 g Intravenous Once Stegmayer, Kimberly A, PA-C      . famotidine (PEPCID) tablet 40 mg  40 mg Oral PRN Stegmayer, Ranae Plumber, PA-C      . HYDROmorphone (DILAUDID) injection 1 mg  1 mg Intravenous Once PRN Stegmayer, Kimberly A, PA-C      . methylPREDNISolone sodium succinate (SOLU-MEDROL) 125 mg/2 mL injection 125 mg  125 mg Intravenous PRN Stegmayer, Kimberly A, PA-C      . ondansetron (ZOFRAN) injection 4 mg  4 mg Intravenous Q6H PRN Stegmayer, Ranae Plumber, PA-C        Past Medical History:  Diagnosis Date  . Carotid artery stenosis   . Diverticulosis   . DVT (deep venous thrombosis) (HCC) 2019  . Factor V Leiden (HCC)   . Hyperlipidemia   . Plantar fasciitis   . Stroke (HCC) 06/2017  . Subacromial impingement of left shoulder 2019    Past Surgical History:  Procedure Laterality Date  . CAROTID PTA/STENT INTERVENTION Left 08/06/2017   Procedure: CAROTID PTA/STENT INTERVENTION;  Surgeon: Renford Dills, MD;  Location: [facility]  INVASIVE CV LAB;  Service: Cardiovascular;  Laterality: Left;  . COLONOSCOPY WITH PROPOFOL N/A 03/17/2015   Procedure: COLONOSCOPY WITH PROPOFOL;  Surgeon: Christena DeemMartin U Skulskie, MD;  [facility] END[facility]COPY;  Service: Endoscopy;  Laterality: N/A;  . VASECTOMY      Social History Social History   Tobacco Use  . Smoking status: Never Smoker  . Smokeless tobacco: Former Engineer, waterUser  Substance Use Topics  . Alcohol use: Not Currently    Comment: occasional beer  . Drug use: No    Family History Family History  Problem Relation Age of Onset  . CAD Mother   . Diabetes Mother   .  CAD Father   . CAD Sister   No family history of bleeding/clotting disorders, porphyria or autoimmune disease   No Known Allergies   REVIEW OF SYSTEMS (Negative unless checked)  Constitutional: [] Weight loss  [] Fever  [] Chills Cardiac: [] Chest pain   [] Chest pressure   [] Palpitations   [] Shortness of breath when laying flat   [] Shortness of breath at rest   [] Shortness of breath with exertion. Vascular:  [] Pain in legs with walking   [] Pain in legs at rest   [] Pain in legs when laying flat   [] Claudication   [] Pain in feet when walking  [] Pain in feet at rest  [] Pain in feet when laying flat   [] History of DVT   [] Phlebitis   [] Swelling in legs   [] Varicose veins   [] Non-healing ulcers Pulmonary:   [] Uses home oxygen   [] Productive cough   [] Hemoptysis   [] Wheeze  [] COPD   [] Asthma Neurologic:  [] Dizziness  [] Blackouts   [] Seizures   [] History of stroke   [x] History of TIA  [] Aphasia   [] Temporary blindness   [] Dysphagia   [x] Weakness or numbness in arms   [] Weakness or numbness in legs Musculoskeletal:  [] Arthritis   [] Joint swelling   [x] Joint pain   [] Low back pain Hematologic:  [] Easy bruising  [] Easy bleeding   [] Hypercoagulable state   [] Anemic  [] Hepatitis Gastrointestinal:  [] Blood in stool   [] Vomiting blood  [] Gastroesophageal reflux/heartburn   [] Difficulty swallowing. Genitourinary:  [] Chronic kidney disease   [] Difficult urination  [] Frequent urination  [] Burning with urination   [] Blood in urine Skin:  [] Rashes   [] Ulcers   [] Wounds Psychological:  [] History of anxiety   []  History of major depression.  Physical Examination  Vitals:   11/12/17 0722  BP: (!) 126/97  Pulse: 74  Resp: 18  Temp: 97.7 F (36.5 C)  TempSrc: Oral  SpO2: 98%  Weight: 188 lb (85.3 kg)  Height: 5\' 5"  (1.651 m)   Body mass index is 31.28 kg/m. Gen: WD/WN, NAD Head: [place]/AT, No temporalis wasting. Prominent temp pulse not noted. Ear/Nose/Throat: Hearing grossly intact, nares w/o erythema or  drainage, oropharynx w/o Erythema/Exudate,  Eyes: Conjunctiva clear, sclera non-icteric Neck: Trachea midline.  No JVD.  Pulmonary:  Good air movement, respirations not labored, no use of accessory muscles.  Cardiac: RRR, normal S1, S2. Vascular: right carotid bruit Vessel Right Left  Radial Palpable Palpable  Ulnar Palpable Palpable  Brachial Palpable Palpable  Carotid Palpable, without bruit Palpable, without bruit  Gastrointestinal: soft, non-tender/non-distended. No guarding/reflex.  Musculoskeletal: M/S 5/5 throughout.  Extremities without ischemic changes.  No deformity or atrophy.  Neurologic: Sensation grossly intact in extremities.  Symmetrical.  Speech is fluent. Motor exam as listed above. Psychiatric: Judgment intact, Mood & affect appropriate for pt's clinical situation. Dermatologic: No rashes or ulcers noted.  No cellulitis or open  wounds. Lymph : No Cervical, Axillary, or Inguinal lymphadenopathy.     CBC Lab Results  Component Value Date   WBC 7.4 08/07/2017   HGB 14.2 08/07/2017   HCT 41.4 08/07/2017   MCV 94.4 08/07/2017   PLT 169 08/07/2017    BMET    Component Value Date/Time   NA 138 08/07/2017 0504   K 4.4 08/07/2017 0504   CL 109 08/07/2017 0504   CO2 23 08/07/2017 0504   GLUCOSE 132 (H) 08/07/2017 0504   BUN 18 11/06/2017 0953   CREATININE 0.99 11/06/2017 0953   CALCIUM 8.3 (L) 08/07/2017 0504   GFRNONAA >60 11/06/2017 0953   GFRAA >60 11/06/2017 0953   Estimated Creatinine Clearance: 73.7 mL/min (by C-G formula based on SCr of 0.99 mg/dL).  COAG Lab Results  Component Value Date   INR 1.61 11/06/2017   INR 0.98 08/05/2017   INR 0.83 07/18/2017    Radiology No results found.    Assessment/Plan 1. Stenosis of left carotid artery Recommend:  The patient is symptomatic with respect to the carotid stenosis.  The patient has a symptomatic lesion that is greater than 80% on the right.  He is status post successful stenting of the left  internal carotid artery and has done well.  He has a true hypercoagulable syndrome with multiple DVTs and is on anticoagulation for life.  Given these factors stenting is much more appropriate than open surgery and we will move forward with stenting of the right internal carotid artery.  Patient's CT angiography of the carotid arteries confirms >70% right internal carotid artery stenosis.  The anatomical considerations support stenting over surgery.  This was discussed in detail with the patient.  The risks, benefits and alternative therapies were reviewed in detail with the patient.  All questions were answered.  The patient agrees to proceed with stenting of the right internal carotid artery.  Continue antiplatelet therapy as prescribed. Continue management of CAD, HTN and Hyperlipidemia. Healthy heart diet, encouraged exercise at least 4 times per week.   2. Acute deep vein thrombosis (DVT) of femoral vein, unspecified laterality (HCC) Continue Eliquis  3. Factor V Leiden (HCC) Lifelong anticoagulation   4.  Hyperlipidemia Continue statin as ordered and reviewed, no changes at this time     Levora Dredge, MD  11/12/2017 8:05 AM

## 2017-11-12 NOTE — Progress Notes (Signed)
A&Ox4 with no neuro deficits noted.  No further bleeding to right groin with PAD in place.  Patient sitting up at 30 degrees in bed now.  Remains on dopamine at 2.5 mcg/kg/min and tolerating well with good blood pressure response.

## 2017-11-13 LAB — CBC
HEMATOCRIT: 39 % — AB (ref 40.0–52.0)
HEMOGLOBIN: 13.3 g/dL (ref 13.0–18.0)
MCH: 32.7 pg (ref 26.0–34.0)
MCHC: 34.1 g/dL (ref 32.0–36.0)
MCV: 95.8 fL (ref 80.0–100.0)
Platelets: 176 10*3/uL (ref 150–440)
RBC: 4.08 MIL/uL — ABNORMAL LOW (ref 4.40–5.90)
RDW: 13.6 % (ref 11.5–14.5)
WBC: 6 10*3/uL (ref 3.8–10.6)

## 2017-11-13 LAB — BASIC METABOLIC PANEL
ANION GAP: 4 — AB (ref 5–15)
BUN: 13 mg/dL (ref 6–20)
CHLORIDE: 110 mmol/L (ref 101–111)
CO2: 22 mmol/L (ref 22–32)
Calcium: 7.9 mg/dL — ABNORMAL LOW (ref 8.9–10.3)
Creatinine, Ser: 0.99 mg/dL (ref 0.61–1.24)
GFR calc non Af Amer: >60 mL/min (ref >60)
Glucose, Bld: 93 mg/dL (ref 65–99)
Potassium: 4 mmol/L (ref 3.5–5.1)
Sodium: 136 mmol/L (ref 135–145)

## 2017-11-13 LAB — PROTIME-INR
INR: 1.02
Prothrombin Time: 13.3 seconds (ref 11.4–15.2)

## 2017-11-13 MED ORDER — CLOPIDOGREL BISULFATE 75 MG PO TABS: 75.0000 mg | ORAL_TABLET | Freq: Every day | ORAL | 0 refills | Status: AC

## 2017-11-13 MED ORDER — FAMOTIDINE 20 MG PO TABS
20.0000 mg | ORAL_TABLET | Freq: Two times a day (BID) | ORAL | Status: DC
  Administered 2017-11-13: 20 mg via ORAL
  Filled 2017-11-13: qty 1

## 2017-11-13 NOTE — Progress Notes (Signed)
Ambulated around ICU for 2 whole laps and tolerated well with no distress.  Right groin site free of complications post ambulation.

## 2017-11-13 NOTE — Progress Notes (Signed)
Pt AAO x4 pt denies pain or discomfort. Pt has PAD to rt groin intact inflated as ordered pt site is a level 0. Pt is up in chair remains Sinus Huston FoleyBrady .dopamine titrated off. Pt up inchair tolerating well

## 2017-11-13 NOTE — Progress Notes (Signed)
Right groin site free of complications.  Patient left ICU by wheelchair alert with no distress noted.  Volunteer took patient to visitors entrance and wife driving him home.  Discharged home.

## 2017-11-13 NOTE — Progress Notes (Signed)
PHARMACIST - PHYSICIAN COMMUNICATION  CONCERNING: IV to Oral Route Change Policy  RECOMMENDATION: This patient is receiving famotidine by the intravenous route.  Based on criteria approved by the Pharmacy and Therapeutics Committee, the intravenous medication(s) is/are being converted to the equivalent oral dose form(s).   DESCRIPTION: These criteria include:  The patient is eating (either orally or via tube) and/or has been taking other orally administered medications for a least 24 hours  The patient has no evidence of active gastrointestinal bleeding or impaired GI absorption (gastrectomy, short bowel, patient on TNA or NPO).  If you have questions about this conversion, please contact the Pharmacy Department  []   [phone]( [phone] )  [facility] [x]   [phone]( [phone] )  [facility] []   [phone]( [phone] )  [facility] []   [phone]( [phone] )  [facility] []   [phone]( [phone] )  [facility]   Jese Comella L, [facility] 11/13/2017 8:34 AM

## 2017-11-13 NOTE — Progress Notes (Signed)
A&Ox4.  Denies pain.  No neuro deficits noted.  Discharge instructions given to and reviewed with patient.  Patient verbalized understanding of all discharge instructions including when to call the doctor, activity restrictions and follow up care.  Patient waiting on his ride home to come.

## 2017-12-04 ENCOUNTER — Other Ambulatory Visit (INDEPENDENT_AMBULATORY_CARE_PROVIDER_SITE_OTHER): Payer: Self-pay | Admitting: Vascular Surgery

## 2017-12-04 DIAGNOSIS — Z959 Presence of cardiac and vascular implant and graft, unspecified: Secondary | ICD-10-CM

## 2017-12-04 DIAGNOSIS — I6521 Occlusion and stenosis of right carotid artery: Secondary | ICD-10-CM

## 2017-12-08 ENCOUNTER — Ambulatory Visit (INDEPENDENT_AMBULATORY_CARE_PROVIDER_SITE_OTHER): Payer: Medicare Other | Admitting: Vascular Surgery

## 2017-12-08 ENCOUNTER — Encounter (INDEPENDENT_AMBULATORY_CARE_PROVIDER_SITE_OTHER): Payer: Medicare Other

## 2018-02-02 ENCOUNTER — Ambulatory Visit (INDEPENDENT_AMBULATORY_CARE_PROVIDER_SITE_OTHER): Payer: Medicare Other | Admitting: Vascular Surgery

## 2018-02-02 ENCOUNTER — Encounter (INDEPENDENT_AMBULATORY_CARE_PROVIDER_SITE_OTHER): Payer: Medicare Other

## 2018-03-06 ENCOUNTER — Encounter (INDEPENDENT_AMBULATORY_CARE_PROVIDER_SITE_OTHER): Payer: Self-pay | Admitting: Nurse Practitioner

## 2018-03-06 ENCOUNTER — Ambulatory Visit (INDEPENDENT_AMBULATORY_CARE_PROVIDER_SITE_OTHER): Payer: Medicare Other | Admitting: Nurse Practitioner

## 2018-03-06 ENCOUNTER — Ambulatory Visit (INDEPENDENT_AMBULATORY_CARE_PROVIDER_SITE_OTHER): Payer: Medicare Other

## 2018-03-06 VITALS — BP 132/89 | HR 77 | Resp 16 | Ht 65.0 in | Wt 185.0 lb

## 2018-03-06 DIAGNOSIS — E785 Hyperlipidemia, unspecified: Secondary | ICD-10-CM | POA: Diagnosis not present

## 2018-03-06 DIAGNOSIS — I6521 Occlusion and stenosis of right carotid artery: Secondary | ICD-10-CM | POA: Diagnosis not present

## 2018-03-06 DIAGNOSIS — I6523 Occlusion and stenosis of bilateral carotid arteries: Secondary | ICD-10-CM | POA: Diagnosis not present

## 2018-03-06 DIAGNOSIS — I82419 Acute embolism and thrombosis of unspecified femoral vein: Secondary | ICD-10-CM

## 2018-03-06 DIAGNOSIS — Z959 Presence of cardiac and vascular implant and graft, unspecified: Secondary | ICD-10-CM | POA: Diagnosis not present

## 2018-03-08 ENCOUNTER — Encounter (INDEPENDENT_AMBULATORY_CARE_PROVIDER_SITE_OTHER): Payer: Self-pay | Admitting: Nurse Practitioner

## 2018-03-08 NOTE — Progress Notes (Signed)
Subjective:    Patient ID: Bryan BirchLarry A Zeleznik Holland., male    DOB: 14-Dec-1950, 67 y.o.   MRN: [id number] Chief Complaint  Patient presents with  . Follow-up    [facility] 2wk carotid    HPI  The patient is seen for follow up evaluation of carotid stenosis status post right carotid stent on 11/12/2017.  There were no post operative problems or complications related to the surgery.  The patient denies neck or incisional pain.  The patient denies interval amaurosis fugax. There is no recent history of TIA symptoms or focal motor deficits. There is prior history of CVA.  Prior history of DVT. The patient denies headache.  The patient is taking enteric-coated aspirin 81 mg daily.  The patient is also wondering about the status of his DVT and whether it has resolved.  The patient has a history of coronary artery disease, no recent episodes of angina or shortness of breath. The patient denies PAD or claudication symptoms. There is a history of hyperlipidemia which is being treated with a statin.      Constitutional: [] Weight loss  [] Fever  [] Chills Cardiac: [] Chest pain   [] Chest pressure   [] Palpitations   [] Shortness of breath when laying flat   [] Shortness of breath with exertion. Vascular:  [] Pain in legs with walking   [] Pain in legs with standing  [x] History of DVT   [] Phlebitis   [] Swelling in legs   [] Varicose veins   [] Non-healing ulcers Pulmonary:   [] Uses home oxygen   [] Productive cough   [] Hemoptysis   [] Wheeze  [] COPD   [] Asthma Neurologic:  [] Dizziness   [] Seizures   [x] History of stroke   [] History of TIA  [] Aphasia   [] Vissual changes   [] Weakness or numbness in arm   [] Weakness or numbness in leg Musculoskeletal:   [] Joint swelling   [] Joint pain   [] Low back pain Hematologic:  [x] Easy bruising  [x] Easy bleeding   [] Hypercoagulable state   [] Anemic Gastrointestinal:  [] Diarrhea   [] Vomiting  [] Gastroesophageal reflux/heartburn   [] Difficulty swallowing. Genitourinary:  [] Chronic  kidney disease   [] Difficult urination  [] Frequent urination   [] Blood in urine Skin:  [] Rashes   [] Ulcers  Psychological:  [] History of anxiety   []  History of major depression.     Objective:   Physical Exam  BP 132/89 (BP Location: Right Arm)   Pulse 77   Resp 16   Ht 5\' 5"  (1.651 m)   Wt 185 lb (83.9 kg)   BMI 30.79 kg/m   Past Medical History:  Diagnosis Date  . Carotid artery stenosis   . Diverticulosis   . DVT (deep venous thrombosis) (HCC) 2019  . Factor V Leiden (HCC)   . Hyperlipidemia   . Plantar fasciitis   . Stroke (HCC) 06/2017  . Subacromial impingement of left shoulder 2019     Gen: WD/WN, NAD Head: [place]/AT, No temporalis wasting.  Ear/Nose/Throat: Hearing grossly intact, nares w/o erythema or drainage Eyes: PER, EOMI, sclera nonicteric.  Neck: Supple, no masses.  No bruit or JVD.  Pulmonary:  Good air movement, clear to auscultation bilaterally, no use of accessory muscles.  Cardiac: RRR Vascular:  Vessel Right Left  Carotid No bruit No bruit  Gastrointestinal: soft, non-distended. No guarding/no peritoneal signs.  Musculoskeletal: M/S 5/5 throughout.  No deformity or atrophy.  Neurologic: Pain and light touch intact in extremities.  Symmetrical.  Speech is fluent. Motor exam as listed above. Psychiatric: Judgment intact, Mood & affect appropriate for pt's clinical  situation. Dermatologic: No Venous rashes no ulcers noted.  No changes consistent with cellulitis. Lymph : No Cervical lymphadenopathy, no lichenification or skin changes of chronic lymphedema.   Social History   Socioeconomic History  . Marital status: Married    Spouse name: Not on file  . Number of children: Not on file  . Years of education: Not on file  . Highest education level: Not on file  Occupational History  . Not on file  Social Needs  . Financial resource strain: Not on file  . Food insecurity:    Worry: Not on file    Inability: Not on file  . Transportation needs:      Medical: Not on file    Non-medical: Not on file  Tobacco Use  . Smoking status: Never Smoker  . Smokeless tobacco: Former Engineer, water and Sexual Activity  . Alcohol use: Not Currently    Comment: occasional beer  . Drug use: No  . Sexual activity: Not on file  Lifestyle  . Physical activity:    Days per week: Not on file    Minutes per session: Not on file  . Stress: Not on file  Relationships  . Social connections:    Talks on phone: Not on file    Gets together: Not on file    Attends religious service: Not on file    Active member of club or organization: Not on file    Attends meetings of clubs or organizations: Not on file    Relationship status: Not on file  . Intimate partner violence:    Fear of current or ex partner: Not on file    Emotionally abused: Not on file    Physically abused: Not on file    Forced sexual activity: Not on file  Other Topics Concern  . Not on file  Social History Narrative  . Not on file    Past Surgical History:  Procedure Laterality Date  . CAROTID PTA/STENT INTERVENTION Left 08/06/2017   Procedure: CAROTID PTA/STENT INTERVENTION;  Surgeon: Renford Dills, MD;  Location: [facility] INVASIVE CV LAB;  Service: Cardiovascular;  Laterality: Left;  . CAROTID PTA/STENT INTERVENTION Right 11/12/2017   Procedure: CAROTID PTA/STENT INTERVENTION;  Surgeon: Renford Dills, MD;  Location: [facility] INVASIVE CV LAB;  Service: Cardiovascular;  Laterality: Right;  . COLONOSCOPY WITH PROPOFOL N/A 03/17/2015   Procedure: COLONOSCOPY WITH PROPOFOL;  Surgeon: Christena Deem, MD;  Location[facility] END[facility]COPY;  Service: Endoscopy;  Laterality: N/A;  . VASECTOMY      Family History  Problem Relation Age of Onset  . CAD Mother   . Diabetes Mother   . CAD Father   . CAD Sister     No Known Allergies     Assessment & Plan:   1. Symptomatic stenosis of both carotid arteries Recommend:  The patient is s/p successful right carotid  stenting  Duplex ultrasound preoperatively shows minimal contralateral stenosis.  Continue antiplatelet therapy as prescribed Continue management of CAD, HTN and Hyperlipidemia Healthy heart diet,  encouraged exercise at least 4 times per week  Follow up in 3 months with duplex ultrasound and physical exam based on the patient's bilateral carotid stenting.  The carotid duplex today revealed no evidence of stenosis of the right or left internal carotid artery.  Bilateral internal carotid arteries displayed patent stents.  There was bilateral vertebral arteries with antegrade flow, as well as normal flow seen in the bilateral subclavian arteries  - VAS US CAROTID;  Future  2. Acute deep vein thrombosis (DVT) of femoral vein, unspecified laterality (HCC)  The patient was diagnosed with a DVT approximately 7 to 8 months ago, and he was concerned if it has dissolved or if it is still present.  The patient has been on Coumadin since its discovery.  We spoke about the natural pathophysiology of deep venous thromboses, however we will perform a lower extremity arterial duplex on his left lower extremity on his next visit to determine if it has completely dissolved. - VAS US LOWER EXTREMITY VENOUS (DVT); Future  3. Hyperlipidemia, unspecified hyperlipidemia type Continue statin as ordered and reviewed, no changes at this time Continue statin as ordered and reviewed, no changes at this time   Current Outpatient Medications on File Prior to Visit  Medication Sig Dispense Refill  . aspirin EC 81 MG EC tablet Take 1 tablet (81 mg total) by mouth daily. 30 tablet 0  . atorvastatin (LIPITOR) 40 MG tablet Take 1 tablet (40 mg total) by mouth daily. 30 tablet 0  . clopidogrel (PLAVIX) 75 MG tablet Take by mouth.    . warfarin (COUMADIN) 5 MG tablet Take 5 mg by mouth See admin instructions. 5 mg on Wednesdays and 2.5 mg on all other days    . acetaminophen (TYLENOL) 500 MG tablet Take 1,000 mg by mouth  every 6 (six) hours as needed for moderate pain or headache.     No current facility-administered medications on file prior to visit.     There are no Patient Instructions on file for this visit. No follow-ups on file.   Georgiana SpinnerFallon E Avianah Pellman, NP

## 2018-05-08 ENCOUNTER — Ambulatory Visit (INDEPENDENT_AMBULATORY_CARE_PROVIDER_SITE_OTHER): Payer: Medicare Other

## 2018-05-08 ENCOUNTER — Encounter (INDEPENDENT_AMBULATORY_CARE_PROVIDER_SITE_OTHER): Payer: Self-pay | Admitting: Nurse Practitioner

## 2018-05-08 ENCOUNTER — Ambulatory Visit (INDEPENDENT_AMBULATORY_CARE_PROVIDER_SITE_OTHER): Payer: Medicare Other | Admitting: Nurse Practitioner

## 2018-05-08 VITALS — BP 151/91 | HR 64 | Resp 17 | Wt 188.0 lb

## 2018-05-08 DIAGNOSIS — E785 Hyperlipidemia, unspecified: Secondary | ICD-10-CM

## 2018-05-08 DIAGNOSIS — D6851 Activated protein C resistance: Secondary | ICD-10-CM | POA: Diagnosis not present

## 2018-05-08 DIAGNOSIS — I6523 Occlusion and stenosis of bilateral carotid arteries: Secondary | ICD-10-CM | POA: Diagnosis not present

## 2018-05-08 DIAGNOSIS — I82419 Acute embolism and thrombosis of unspecified femoral vein: Secondary | ICD-10-CM

## 2018-05-08 DIAGNOSIS — Z7982 Long term (current) use of aspirin: Secondary | ICD-10-CM

## 2018-05-11 ENCOUNTER — Encounter (INDEPENDENT_AMBULATORY_CARE_PROVIDER_SITE_OTHER): Payer: Self-pay | Admitting: Nurse Practitioner

## 2018-05-11 NOTE — Progress Notes (Signed)
Subjective:    Patient ID: Bryan Birch Sr., male    DOB: [birth year], 67 y.o.   MRN: [id number] Chief Complaint  Patient presents with  . Follow-up    30month ultrasound follow up    HPI  Bryan DOLECKI Sr. is a 67 y.o. male that is seen for follow up evaluation of carotid stenosis. The carotid stenosis followed by ultrasound.   He had a right carotid stent placed on 11/12/2017 and a left internal carotid artery stent placed on 08/06/2017.  The patient denies amaurosis fugax. There is no recent history of TIA symptoms or focal motor deficits. There is prior documented CVA.  The patient is taking enteric-coated aspirin 81 mg daily.  There is no history of migraine headaches. There is no history of seizures.  The patient has a history of coronary artery disease, no recent episodes of angina or shortness of breath. The patient denies PAD or claudication symptoms. There is a history of hyperlipidemia which is being treated with a statin.    Duplex ultrasound shows no evidence of stenosis bilaterally.  No evidence of thrombus, dissection, or atherosclerotic plaque.  Bilateral stents patent throughout.  The patient was also concerned about previous DVTs and we repeated ultrasounds today.  Repeat bilateral venous duplex revealed no evidence of DVT in the right lower extremity.  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 Carotid artery stenosis   . Diverticulosis   . DVT (deep venous thrombosis) (HCC) 2019  . Factor V Leiden (HCC)   . Hyperlipidemia   . Plantar fasciitis   . Stroke (HCC) 06/2017  . Subacromial impingement of left shoulder 2019    Past Surgical History:  Procedure Laterality Date  . CAROTID PTA/STENT INTERVENTION Left 08/06/2017   Procedure: CAROTID PTA/STENT INTERVENTION;  Surgeon: Renford Dills, MD;  Location: [facility] INVASIVE CV LAB;  Service: Cardiovascular;  Laterality: Left;    . CAROTID PTA/STENT INTERVENTION Right 11/12/2017   Procedure: CAROTID PTA/STENT INTERVENTION;  Surgeon: Renford Dills, MD;  Location: [facility] INVASIVE CV LAB;  Service: Cardiovascular;  Laterality: Right;  . COLONOSCOPY WITH PROPOFOL N/A 03/17/2015   Procedure: COLONOSCOPY WITH PROPOFOL;  Surgeon: Christena Deem, MD;  Location: [facility] ENDOSCOPY;  Service: Endoscopy;  Laterality: N/A;  . VASECTOMY      Social History   Socioeconomic History  . Marital status: Married    Spouse name: Not on file  . Number of children: Not on file  . Years of education: Not on file  . Highest education level: Not on file  Occupational History  . Not on file  Social Needs  . Financial resource strain: Not on file  . Food insecurity:    Worry: Not on file    Inability: Not on file  . Transportation needs:    Medical: Not on file    Non-medical: Not on file  Tobacco Use  . Smoking status: Never Smoker  . Smokeless tobacco: Former Engineer, water and Sexual Activity  . Alcohol use: Not Currently    Comment: occasional beer  . Drug use: No  . Sexual activity: Not on file  Lifestyle  . Physical activity:    Days per week: Not on file    Minutes per session: Not on file  . Stress: Not on file  Relationships  . Social connections:    Talks on phone: Not on file  Gets together: Not on file    Attends religious service: Not on file    Active member of club or organization: Not on file    Attends meetings of clubs or organizations: Not on file    Relationship status: Not on file  . Intimate partner violence:    Fear of current or ex partner: Not on file    Emotionally abused: Not on file    Physically abused: Not on file    Forced sexual activity: Not on file  Other Topics Concern  . Not on file  Social History Narrative  . Not on file    Family History  Problem Relation Age of Onset  . CAD Mother   . Diabetes Mother   . CAD Father   . CAD Sister     No Known  Allergies   Review of Systems   Review of Systems: Negative Unless Checked Constitutional: [] Weight loss  [] Fever  [] Chills Cardiac: [] Chest pain   []  Atrial Fibrillation  [] Palpitations   [] Shortness of breath when laying flat   [] Shortness of breath with exertion. Vascular:  [] Pain in legs with walking   [] Pain in legs with standing  [x] History of DVT   [] Phlebitis   [] Swelling in legs   [] Varicose veins   [] Non-healing ulcers Pulmonary:   [] Uses home oxygen   [] Productive cough   [] Hemoptysis   [] Wheeze  [] COPD   [] Asthma Neurologic:  [] Dizziness   [] Seizures   [x] History of stroke   [] History of TIA  [] Aphasia   [] Vissual changes   [] Weakness or numbness in arm   [] Weakness or numbness in leg Musculoskeletal:   [] Joint swelling   [] Joint pain   [] Low back pain  []  History of Knee Replacement Hematologic:  [] Easy bruising  [] Easy bleeding   [x] Hypercoagulable state   [] Anemic Gastrointestinal:  [] Diarrhea   [] Vomiting  [] Gastroesophageal reflux/heartburn   [] Difficulty swallowing. Genitourinary:  [] Chronic kidney disease   [] Difficult urination  [] Anuric   [] Blood in urine Skin:  [] Rashes   [] Ulcers  Psychological:  [] History of anxiety   []  History of major depression  []  Memory Difficulties     Objective:   Physical Exam  BP (!) 151/91 (BP Location: Right Arm)   Pulse 64   Resp 17   Wt 188 lb (85.3 kg)   BMI 31.28 kg/m   Gen: WD/WN, NAD Head: [place]/AT, No temporalis wasting.  Ear/Nose/Throat: Hearing grossly intact, nares w/o erythema or drainage Eyes: PER, EOMI, sclera nonicteric.  Neck: Supple, no masses.  No JVD.  Pulmonary:  Good air movement, no use of accessory muscles.  Cardiac: RRR Vascular: no bruits heard Vessel Right Left  Radial Palpable Palpable  Dorsalis Pedis Palpable Palpable  Posterior Tibial Palpable Palpable   Gastrointestinal: soft, non-distended. No guarding/no peritoneal signs.  Musculoskeletal: M/S 5/5 throughout.  No deformity or atrophy.   Neurologic: Pain and light touch intact in extremities.  Symmetrical.  Speech is fluent. Motor exam as listed above. Psychiatric: Judgment intact, Mood & affect appropriate for pt's clinical situation. Dermatologic: No Venous rashes. No Ulcers Noted.  No changes consistent with cellulitis. Lymph : No Cervical lymphadenopathy, no lichenification or skin changes of chronic lymphedema.      Assessment & Plan:   1. Bilateral carotid artery stenosis Recommend:  Given the patient's asymptomatic subcritical stenosis no further invasive testing or surgery at this time.  Duplex ultrasound shows no evidence of stenosis bilaterally.  No evidence of thrombus, dissection, or atherosclerotic plaque.  Bilateral  stents patent throughout.  Continue antiplatelet therapy as prescribed Continue management of CAD, HTN and Hyperlipidemia Healthy heart diet,  encouraged exercise at least 4 times per week  Advised to stop plavix on 05/18/2018, six months after latest stent placement.    Follow up in 6 months with duplex ultrasound and physical exam  - VAS US CAROTID; Future  2. Acute deep vein thrombosis (DVT) of femoral vein, unspecified laterality (HCC) Repeat bilateral venous duplex revealed no evidence of DVT in the right lower extremity.  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 Will continue on chronic anticoagulation.   3. Factor V Leiden [facility]) Patient is currently on long-term anticoagulation.  He is currently utilizing Coumadin.  He has plans to switch over to Eliquis or Xarelto after the beginning of the year once his insurance plan changes.  4. Hyperlipidemia, unspecified hyperlipidemia type Continue statin as ordered and reviewed, no changes at this time    Current Outpatient Medications on File Prior to Visit  Medication Sig Dispense Refill  . acetaminophen (TYLENOL) 500 MG tablet Take 1,000 mg by mouth every 6 (six) hours as needed for moderate pain  or headache.    [name] aspirin EC 81 MG EC tablet Take 1 tablet (81 mg total) by mouth daily. 30 tablet 0  . atorvastatin (LIPITOR) 40 MG tablet Take 1 tablet (40 mg total) by mouth daily. 30 tablet 0  . clopidogrel (PLAVIX) 75 MG tablet Take by mouth.    . warfarin (COUMADIN) 5 MG tablet Take 5 mg by mouth See admin instructions. 5 mg on Wednesdays and 2.5 mg on all other days     No current facility-administered medications on file prior to visit.     There are no Patient Instructions on file for this visit. Return in about 6 months (around 11/07/2018).   [name], NP  This note was completed with Office manager.  Any errors are purely unintentional.

## 2018-06-01 ENCOUNTER — Encounter (INDEPENDENT_AMBULATORY_CARE_PROVIDER_SITE_OTHER): Payer: Medicare Other

## 2018-06-01 ENCOUNTER — Ambulatory Visit (INDEPENDENT_AMBULATORY_CARE_PROVIDER_SITE_OTHER): Payer: Medicare Other | Admitting: Vascular Surgery

## 2018-06-01 ENCOUNTER — Encounter

## 2018-07-27 ENCOUNTER — Telehealth (INDEPENDENT_AMBULATORY_CARE_PROVIDER_SITE_OTHER): Payer: Self-pay

## 2018-07-27 NOTE — Telephone Encounter (Signed)
Patient called and left a message on the triage line and stated he wanted to have a call back that he had questions. Called patient at [phone] (mobile). He is on Plan D on his medicare and now he can get Eliquis. He would like to know if he can switch over and how soon can he do that.   Please advise

## 2018-07-28 MED ORDER — APIXABAN 5 MG PO TABS: 5.0000 mg | ORAL_TABLET | Freq: Two times a day (BID) | ORAL | 1 refills | Status: AC

## 2018-07-28 NOTE — Telephone Encounter (Signed)
Call and confirm his pharmacy for me please.  Let him know that we can send a prescription for eliquis to his pharmacy, but that he needs to stop his coumadin for three days and then can start the eliquis.  He will take one 5 mg tablet twice a day.  Let me know if he has questions.

## 2018-07-28 NOTE — Telephone Encounter (Signed)
Called and verified which pharmacy pt wanted this rx to go to. Called pharmacy and they stated they do not have pt's part D on file if pt does in fact have this he would need to bring copy to pharmacy. Called pt and spoke with him and his wife who both stated he does have a new card WITH part D and they will bring copy to pharmacy. Pharmacy stated they could not check to see if this is covered as to not having a copy. Went over the instructions with the pt of how to transituation medications if all goes well at pharmacy. Pt understood and had no additional questions at this time.

## 2018-07-28 NOTE — Addendum Note (Signed)
Addended by: Garfield Cornea L on: 07/28/2018 09:45 AM   Modules accepted: Orders

## 2018-08-11 ENCOUNTER — Encounter (INDEPENDENT_AMBULATORY_CARE_PROVIDER_SITE_OTHER): Payer: Medicare Other

## 2018-08-11 ENCOUNTER — Encounter

## 2018-08-11 ENCOUNTER — Ambulatory Visit (INDEPENDENT_AMBULATORY_CARE_PROVIDER_SITE_OTHER): Payer: Medicare Other | Admitting: Vascular Surgery

## 2018-11-09 ENCOUNTER — Encounter (INDEPENDENT_AMBULATORY_CARE_PROVIDER_SITE_OTHER): Payer: Medicare Other

## 2018-11-09 ENCOUNTER — Ambulatory Visit (INDEPENDENT_AMBULATORY_CARE_PROVIDER_SITE_OTHER): Payer: Medicare Other | Admitting: Nurse Practitioner

## 2018-12-07 ENCOUNTER — Other Ambulatory Visit (INDEPENDENT_AMBULATORY_CARE_PROVIDER_SITE_OTHER): Payer: Self-pay | Admitting: Vascular Surgery

## 2018-12-07 DIAGNOSIS — I6529 Occlusion and stenosis of unspecified carotid artery: Secondary | ICD-10-CM

## 2018-12-08 ENCOUNTER — Telehealth (INDEPENDENT_AMBULATORY_CARE_PROVIDER_SITE_OTHER): Payer: Self-pay | Admitting: Nurse Practitioner

## 2018-12-08 NOTE — Telephone Encounter (Signed)
Patient called to double check apt for Thursday 5/21 at 830am. I advised patient of the apt time and that he would need to wear a mask and that only the patient could come into the building. He verbalized understanding and confirmed apt. AS, CMA

## 2018-12-10 ENCOUNTER — Encounter (INDEPENDENT_AMBULATORY_CARE_PROVIDER_SITE_OTHER): Payer: Self-pay | Admitting: Nurse Practitioner

## 2018-12-10 ENCOUNTER — Ambulatory Visit (INDEPENDENT_AMBULATORY_CARE_PROVIDER_SITE_OTHER): Payer: Medicare Other | Admitting: Nurse Practitioner

## 2018-12-10 ENCOUNTER — Ambulatory Visit (INDEPENDENT_AMBULATORY_CARE_PROVIDER_SITE_OTHER): Payer: Medicare Other

## 2018-12-10 ENCOUNTER — Telehealth (INDEPENDENT_AMBULATORY_CARE_PROVIDER_SITE_OTHER): Payer: Self-pay

## 2018-12-10 ENCOUNTER — Other Ambulatory Visit: Payer: Self-pay

## 2018-12-10 VITALS — BP 155/99 | HR 66 | Resp 10 | Ht 65.0 in | Wt 190.0 lb

## 2018-12-10 DIAGNOSIS — E785 Hyperlipidemia, unspecified: Secondary | ICD-10-CM

## 2018-12-10 DIAGNOSIS — I6523 Occlusion and stenosis of bilateral carotid arteries: Secondary | ICD-10-CM

## 2018-12-10 DIAGNOSIS — Z7901 Long term (current) use of anticoagulants: Secondary | ICD-10-CM | POA: Diagnosis not present

## 2018-12-10 DIAGNOSIS — D6851 Activated protein C resistance: Secondary | ICD-10-CM | POA: Diagnosis not present

## 2018-12-10 DIAGNOSIS — Z7902 Long term (current) use of antithrombotics/antiplatelets: Secondary | ICD-10-CM

## 2018-12-10 DIAGNOSIS — I6529 Occlusion and stenosis of unspecified carotid artery: Secondary | ICD-10-CM

## 2018-12-10 NOTE — Telephone Encounter (Signed)
Patient had called to ask the generic name for Plavix and the patient has been made aware the medication name is Clopidogrel.

## 2018-12-11 DIAGNOSIS — Z0000 Encounter for general adult medical examination without abnormal findings: Secondary | ICD-10-CM | POA: Insufficient documentation

## 2018-12-14 ENCOUNTER — Encounter (INDEPENDENT_AMBULATORY_CARE_PROVIDER_SITE_OTHER): Payer: Self-pay | Admitting: Nurse Practitioner

## 2018-12-14 NOTE — Progress Notes (Signed)
SUBJECTIVE:  Patient ID: Bryan Birch Sr., male    DOB: 1951-05-27, 68 y.o.   MRN: [id number] Chief Complaint  Patient presents with  . Follow-up    HPI  Bryan BEHNING Sr. is a 68 y.o. male The patient is seen for follow up evaluation of carotid stenosis status post right carotid stent on 11/12/2017 and left ICA stent on 08/06/2017.  There were no post operative problems or complications related to the surgery.  The patient denies neck or incisional pain.  The patient denies interval amaurosis fugax. There is no recent history of TIA symptoms or focal motor deficits. There is no prior documented CVA.  The patient denies headache.  There is a history of hyperlipidemia which is being treated with a statin.   The patient also reports new left hand numbness and tingling which occurs after he sleeps.  It is not his whole hand but rather two or three fingers at a time. The patient also has had previous carpal tunnel surgeries.  Noninvasive studies show the right internal carotid artery has velocities consistent with 60 to 79% stenosis.  The stent is patent with a new decreased diameter in the mid to distal stent with decreased flow velocities proximally in the common carotid artery.  The left internal carotid artery has velocities consistent with a 1 to 39% stenosis.  The left internal carotid artery stent has no evidence of significant stenosis.  Previous studies done on 05/08/2018 show patent right and left internal carotid artery stents.  Past Medical History:  Diagnosis Date  . Carotid artery stenosis   . Diverticulosis   . DVT (deep venous thrombosis) (HCC) 2019  . Factor V Leiden (HCC)   . Hyperlipidemia   . Plantar fasciitis   . Stroke (HCC) 06/2017  . Subacromial impingement of left shoulder 2019    Past Surgical History:  Procedure Laterality Date  . CAROTID PTA/STENT INTERVENTION Left 08/06/2017   Procedure: CAROTID PTA/STENT INTERVENTION;  Surgeon: Renford Dills, MD;  Location: [facility] INVASIVE CV LAB;  Service: Cardiovascular;  Laterality: Left;  . CAROTID PTA/STENT INTERVENTION Right 11/12/2017   Procedure: CAROTID PTA/STENT INTERVENTION;  Surgeon: Renford Dills, MD;  Location: [facility] INVASIVE CV LAB;  Service: Cardiovascular;  Laterality: Right;  . COLONOSCOPY WITH PROPOFOL N/A 03/17/2015   Procedure: COLONOSCOPY WITH PROPOFOL;  Surgeon: Christena Deem, MD;  Location: [facility] ENDOSCOPY;  Service: Endoscopy;  Laterality: N/A;  . VASECTOMY      Social History   Socioeconomic History  . Marital status: Married    Spouse name: Not on file  . Number of children: Not on file  . Years of education: Not on file  . Highest education level: Not on file  Occupational History  . Not on file  Social Needs  . Financial resource strain: Not on file  . Food insecurity:    Worry: Not on file    Inability: Not on file  . Transportation needs:    Medical: Not on file    Non-medical: Not on file  Tobacco Use  . Smoking status: Never Smoker  . Smokeless tobacco: Former Engineer, water and Sexual Activity  . Alcohol use: Not Currently    Comment: occasional beer  . Drug use: No  . Sexual activity: Not on file  Lifestyle  . Physical activity:    Days per week: Not on file    Minutes per session: Not on file  . Stress: Not on file  Relationships  .  Social connections:    Talks on phone: Not on file    Gets together: Not on file    Attends religious service: Not on file    Active member of club or organization: Not on file    Attends meetings of clubs or organizations: Not on file    Relationship status: Not on file  . Intimate partner violence:    Fear of current or ex partner: Not on file    Emotionally abused: Not on file    Physically abused: Not on file    Forced sexual activity: Not on file  Other Topics Concern  . Not on file  Social History Narrative  . Not on file    Family History  Problem Relation Age of Onset  . CAD Mother    . Diabetes Mother   . CAD Father   . CAD Sister     No Known Allergies   Review of Systems   Review of Systems: Negative Unless Checked Constitutional: [] Weight loss  [] Fever  [] Chills Cardiac: [] Chest pain   []  Atrial Fibrillation  [] Palpitations   [] Shortness of breath when laying flat   [] Shortness of breath with exertion. [] Shortness of breath at rest Vascular:  [] Pain in legs with walking   [] Pain in legs with standing [] Pain in legs when laying flat   [] Claudication    [] Pain in feet when laying flat    [x] History of DVT   [] Phlebitis   [] Swelling in legs   [] Varicose veins   [] Non-healing ulcers Pulmonary:   [] Uses home oxygen   [] Productive cough   [] Hemoptysis   [] Wheeze  [] COPD   [] Asthma Neurologic:  [] Dizziness   [] Seizures  [] Blackouts [x] History of stroke   [x] History of TIA  [] Aphasia   [] Temporary Blindness   [] Weakness or numbness in arm   [] Weakness or numbness in leg Musculoskeletal:   [] Joint swelling   [] Joint pain   [] Low back pain  []  History of Knee Replacement [] Arthritis [] back Surgeries  []  Spinal Stenosis    Hematologic:  [] Easy bruising  [] Easy bleeding   [] Hypercoagulable state   [] Anemic Gastrointestinal:  [] Diarrhea   [] Vomiting  [] Gastroesophageal reflux/heartburn   [] Difficulty swallowing. [] Abdominal pain Genitourinary:  [] Chronic kidney disease   [] Difficult urination  [] Anuric   [] Blood in urine [] Frequent urination  [] Burning with urination   [] Hematuria Skin:  [] Rashes   [] Ulcers [] Wounds Psychological:  [] History of anxiety   []  History of major depression  []  Memory Difficulties      OBJECTIVE:   Physical Exam  BP (!) 155/99 (BP Location: Left Arm, Patient Position: Sitting, Cuff Size: Small)   Pulse 66   Resp 10   Ht 5\' 5"  (1.651 m)   Wt 190 lb (86.2 kg)   BMI 31.62 kg/m   Gen: WD/WN, NAD Head: [place]/AT, No temporalis wasting.  Ear/Nose/Throat: Hearing grossly intact, nares w/o erythema or drainage Eyes: PER, EOMI, sclera nonicteric.   Neck: Supple, no masses.  No JVD.  Pulmonary:  Good air movement, no use of accessory muscles.  Cardiac: RRR Vascular:  Vessel Right Left  Radial Palpable Palpable   Gastrointestinal: soft, non-distended. No guarding/no peritoneal signs.  Musculoskeletal: M/S 5/5 throughout.  No deformity or atrophy.  Neurologic: Pain and light touch intact in extremities.  Symmetrical.  Speech is fluent. Motor exam as listed above. Psychiatric: Judgment intact, Mood & affect appropriate for pt's clinical situation. Dermatologic: No Venous rashes. No Ulcers Noted.  No changes consistent with cellulitis. Lymph : No  Cervical lymphadenopathy, no lichenification or skin changes of chronic lymphedema.       ASSESSMENT AND PLAN:  1. Bilateral carotid artery stenosis Recommend:  The patient's previously patent right ICA now has 60-79% stenosis. Per ultrasonographer report the stent may have a "kink", however it could also be hyperplasia of the ICA.   The risks, benefits and alternative therapies were reviewed in detail with the patient.  All questions were answered.  The patient agrees to proceed with stenting of the right carotid artery.  The patient was instructed to begin taking plavix and ASA again  Continue antiplatelet therapy as prescribed. Continue management of CAD, HTN and Hyperlipidemia. Healthy heart diet, encouraged exercise at least 4 times per week.   Patient's left hand numbness not consistent with strokelike symptoms.  Due to the fact that the pain comes and goes and generally is elicited with sleep. Patient is advised to speak with PCP regarding further workup   2. Hyperlipidemia, unspecified hyperlipidemia type Continue statin as ordered and reviewed, no changes at this time   3. Factor V Leiden [facility]) Patient is currently anticoagulated and will be anticoagulated for life.  He currently takes Eliquis without issue.   Current Outpatient Medications on File Prior to Visit   Medication Sig Dispense Refill  . acetaminophen (TYLENOL) 500 MG tablet Take 1,000 mg by mouth every 6 (six) hours as needed for moderate pain or headache.    [name] apixaban (ELIQUIS) 5 MG TABS tablet Take 1 tablet (5 mg total) by mouth 2 (two) times daily. 180 tablet 1  . atorvastatin (LIPITOR) 40 MG tablet Take 1 tablet (40 mg total) by mouth daily. 30 tablet 0  . aspirin EC 81 MG EC tablet Take 1 tablet (81 mg total) by mouth daily. (Patient not taking: Reported on 12/10/2018) 30 tablet 0  . clopidogrel (PLAVIX) 75 MG tablet Take by mouth.    . warfarin (COUMADIN) 5 MG tablet Take 5 mg by mouth See admin instructions. 5 mg on Wednesdays and 2.5 mg on all other days     No current facility-administered medications on file prior to visit.     There are no Patient Instructions on file for this visit. No follow-ups on file.   [name], NP  This note was completed with Office manager.  Any errors are purely unintentional.

## 2018-12-25 ENCOUNTER — Encounter (INDEPENDENT_AMBULATORY_CARE_PROVIDER_SITE_OTHER): Payer: Self-pay

## 2018-12-30 ENCOUNTER — Other Ambulatory Visit (INDEPENDENT_AMBULATORY_CARE_PROVIDER_SITE_OTHER): Payer: Self-pay | Admitting: Nurse Practitioner

## 2019-01-01 ENCOUNTER — Encounter
Admission: RE | Admit: 2019-01-01 | Discharge: 2019-01-01 | Disposition: A | Discharge status: Home / Self Care | Payer: Medicare Other | Source: Ambulatory Visit | Attending: Internal Medicine | Admitting: Internal Medicine

## 2019-01-01 ENCOUNTER — Other Ambulatory Visit: Payer: Self-pay

## 2019-01-01 DIAGNOSIS — Z01812 Encounter for preprocedural laboratory examination: Secondary | ICD-10-CM | POA: Insufficient documentation

## 2019-01-01 DIAGNOSIS — I6529 Occlusion and stenosis of unspecified carotid artery: Secondary | ICD-10-CM | POA: Insufficient documentation

## 2019-01-01 DIAGNOSIS — Z1159 Encounter for screening for other viral diseases: Secondary | ICD-10-CM | POA: Insufficient documentation

## 2019-01-01 LAB — CREATININE, SERUM
Creatinine, Ser: 1.02 mg/dL (ref 0.61–1.24)
GFR calc Af Amer: >60 mL/min (ref >60)
GFR calc non Af Amer: >60 mL/min (ref >60)

## 2019-01-01 LAB — BUN: BUN: 17 mg/dL (ref 8–23)

## 2019-01-02 LAB — NOVEL CORONAVIRUS, NAA (HOSP ORDER, SEND-OUT TO REF LAB; TAT 18-24 HRS): SARS-CoV-2, NAA: NOT DETECTED

## 2019-01-05 MED ORDER — CEFAZOLIN SODIUM-DEXTROSE 2-4 GM/100ML-% IV SOLN
2.0000 g | Freq: Once | INTRAVENOUS | Status: AC
  Administered 2019-01-06: 2 g via INTRAVENOUS

## 2019-01-06 ENCOUNTER — Other Ambulatory Visit: Payer: Self-pay

## 2019-01-06 ENCOUNTER — Ambulatory Visit
Admission: RE | Admit: 2019-01-06 | Discharge: 2019-01-06 | Disposition: A | Discharge status: Home / Self Care | Payer: Medicare Other | Attending: Vascular Surgery | Admitting: Vascular Surgery

## 2019-01-06 ENCOUNTER — Encounter
Admission: RE | Disposition: A | Discharge status: Home / Self Care | Payer: Self-pay | Source: Home / Self Care | Attending: Vascular Surgery

## 2019-01-06 DIAGNOSIS — I6521 Occlusion and stenosis of right carotid artery: Secondary | ICD-10-CM | POA: Insufficient documentation

## 2019-01-06 DIAGNOSIS — E785 Hyperlipidemia, unspecified: Secondary | ICD-10-CM | POA: Diagnosis not present

## 2019-01-06 DIAGNOSIS — Z95820 Peripheral vascular angioplasty status with implants and grafts: Secondary | ICD-10-CM | POA: Diagnosis not present

## 2019-01-06 DIAGNOSIS — T82856A Stenosis of peripheral vascular stent, initial encounter: Secondary | ICD-10-CM | POA: Insufficient documentation

## 2019-01-06 DIAGNOSIS — Z7902 Long term (current) use of antithrombotics/antiplatelets: Secondary | ICD-10-CM | POA: Diagnosis not present

## 2019-01-06 DIAGNOSIS — Z9852 Vasectomy status: Secondary | ICD-10-CM | POA: Diagnosis not present

## 2019-01-06 DIAGNOSIS — D6851 Activated protein C resistance: Secondary | ICD-10-CM | POA: Insufficient documentation

## 2019-01-06 DIAGNOSIS — Z86718 Personal history of other venous thrombosis and embolism: Secondary | ICD-10-CM | POA: Insufficient documentation

## 2019-01-06 DIAGNOSIS — Y831 Surgical operation with implant of artificial internal device as the cause of abnormal reaction of the patient, or of later complication, without mention of misadventure at the time of the procedure: Secondary | ICD-10-CM | POA: Insufficient documentation

## 2019-01-06 DIAGNOSIS — Z8673 Personal history of transient ischemic attack (TIA), and cerebral infarction without residual deficits: Secondary | ICD-10-CM | POA: Diagnosis not present

## 2019-01-06 DIAGNOSIS — I6522 Occlusion and stenosis of left carotid artery: Secondary | ICD-10-CM

## 2019-01-06 DIAGNOSIS — Z7982 Long term (current) use of aspirin: Secondary | ICD-10-CM | POA: Insufficient documentation

## 2019-01-06 DIAGNOSIS — Z79899 Other long term (current) drug therapy: Secondary | ICD-10-CM | POA: Insufficient documentation

## 2019-01-06 DIAGNOSIS — Z7901 Long term (current) use of anticoagulants: Secondary | ICD-10-CM | POA: Insufficient documentation

## 2019-01-06 DIAGNOSIS — Z8249 Family history of ischemic heart disease and other diseases of the circulatory system: Secondary | ICD-10-CM | POA: Diagnosis not present

## 2019-01-06 HISTORY — PX: CAROTID PTA/STENT INTERVENTION: CATH118231

## 2019-01-06 LAB — POCT ACTIVATED CLOTTING TIME: Activated Clotting Time: 268 seconds

## 2019-01-06 SURGERY — CAROTID PTA/STENT INTERVENTION
Anesthesia: Moderate Sedation | Laterality: Right

## 2019-01-06 MED ORDER — HEPARIN SODIUM (PORCINE) 1000 UNIT/ML IJ SOLN
INTRAMUSCULAR | Status: AC
  Filled 2019-01-06: qty 1

## 2019-01-06 MED ORDER — OXYCODONE HCL 5 MG PO TABS: 5.0000 mg | ORAL_TABLET | ORAL | Status: DC | PRN

## 2019-01-06 MED ORDER — ESMOLOL HCL-SODIUM CHLORIDE 2000 MG/100ML IV SOLN
INTRAVENOUS | Status: AC
  Filled 2019-01-06: qty 100

## 2019-01-06 MED ORDER — SODIUM CHLORIDE 0.9 % IV SOLN: 250.0000 mL | INTRAVENOUS | Status: DC | PRN

## 2019-01-06 MED ORDER — METHYLPREDNISOLONE SODIUM SUCC 125 MG IJ SOLR: 125.0000 mg | Freq: Once | INTRAMUSCULAR | Status: DC | PRN

## 2019-01-06 MED ORDER — ACETAMINOPHEN 325 MG PO TABS: 650.0000 mg | ORAL_TABLET | ORAL | Status: DC | PRN

## 2019-01-06 MED ORDER — DIPHENHYDRAMINE HCL 50 MG/ML IJ SOLN: 50.0000 mg | Freq: Once | INTRAMUSCULAR | Status: DC | PRN

## 2019-01-06 MED ORDER — FENTANYL CITRATE (PF) 100 MCG/2ML IJ SOLN
INTRAMUSCULAR | Status: AC
  Filled 2019-01-06: qty 2

## 2019-01-06 MED ORDER — HEPARIN SODIUM (PORCINE) 1000 UNIT/ML IJ SOLN
INTRAMUSCULAR | Status: DC | PRN
  Administered 2019-01-06: 6000 [IU] via INTRAVENOUS
  Administered 2019-01-06: 3000 [IU] via INTRAVENOUS

## 2019-01-06 MED ORDER — FENTANYL CITRATE (PF) 100 MCG/2ML IJ SOLN
INTRAMUSCULAR | Status: DC | PRN
  Administered 2019-01-06: 25 ug via INTRAVENOUS
  Administered 2019-01-06 (×2): 50 ug via INTRAVENOUS

## 2019-01-06 MED ORDER — SODIUM CHLORIDE 0.9% FLUSH: 3.0000 mL | INTRAVENOUS | Status: DC | PRN

## 2019-01-06 MED ORDER — PHENYLEPHRINE HCL (PRESSORS) 10 MG/ML IV SOLN
INTRAVENOUS | Status: AC
  Filled 2019-01-06: qty 1

## 2019-01-06 MED ORDER — ONDANSETRON HCL 4 MG/2ML IJ SOLN: 4.0000 mg | Freq: Four times a day (QID) | INTRAMUSCULAR | Status: DC | PRN

## 2019-01-06 MED ORDER — IODIXANOL 320 MG/ML IV SOLN
INTRAVENOUS | Status: DC | PRN
  Administered 2019-01-06: 65 mL via INTRA_ARTERIAL

## 2019-01-06 MED ORDER — SODIUM CHLORIDE 0.9 % IV SOLN
INTRAVENOUS | Status: DC
  Administered 2019-01-06: 08:00:00 via INTRAVENOUS

## 2019-01-06 MED ORDER — MIDAZOLAM HCL 2 MG/ML PO SYRP: 8.0000 mg | ORAL_SOLUTION | Freq: Once | ORAL | Status: DC | PRN

## 2019-01-06 MED ORDER — MIDAZOLAM HCL 5 MG/5ML IJ SOLN
INTRAMUSCULAR | Status: AC
  Filled 2019-01-06: qty 5

## 2019-01-06 MED ORDER — ATROPINE SULFATE 1 MG/10ML IJ SOSY
PREFILLED_SYRINGE | INTRAMUSCULAR | Status: AC
  Filled 2019-01-06: qty 10

## 2019-01-06 MED ORDER — SODIUM CHLORIDE 0.9% FLUSH: 3.0000 mL | Freq: Two times a day (BID) | INTRAVENOUS | Status: DC

## 2019-01-06 MED ORDER — HYDRALAZINE HCL 20 MG/ML IJ SOLN: 5.0000 mg | INTRAMUSCULAR | Status: DC | PRN

## 2019-01-06 MED ORDER — HYDROMORPHONE HCL 1 MG/ML IJ SOLN: 1.0000 mg | Freq: Once | INTRAMUSCULAR | Status: DC | PRN

## 2019-01-06 MED ORDER — SODIUM CHLORIDE 0.9 % IV SOLN: INTRAVENOUS | Status: DC

## 2019-01-06 MED ORDER — MORPHINE SULFATE (PF) 4 MG/ML IV SOLN: 2.0000 mg | INTRAVENOUS | Status: DC | PRN

## 2019-01-06 MED ORDER — FAMOTIDINE 20 MG PO TABS: 40.0000 mg | ORAL_TABLET | Freq: Once | ORAL | Status: DC | PRN

## 2019-01-06 MED ORDER — DOPAMINE-DEXTROSE 3.2-5 MG/ML-% IV SOLN
INTRAVENOUS | Status: AC
  Filled 2019-01-06: qty 250

## 2019-01-06 MED ORDER — LABETALOL HCL 5 MG/ML IV SOLN: 10.0000 mg | INTRAVENOUS | Status: DC | PRN

## 2019-01-06 MED ORDER — MIDAZOLAM HCL 2 MG/2ML IJ SOLN
INTRAMUSCULAR | Status: DC | PRN
  Administered 2019-01-06: 2 mg via INTRAVENOUS
  Administered 2019-01-06: 0.5 mg via INTRAVENOUS
  Administered 2019-01-06: 1 mg via INTRAVENOUS

## 2019-01-06 SURGICAL SUPPLY — 19 items
CANNULA 5F STIFF (CANNULA) ×3 IMPLANT
CATH ANGIO 5F 100CM .035 PIG (CATHETERS) ×3 IMPLANT
CATH BEACON 5 .035 100 H1 TIP (CATHETERS) ×3 IMPLANT
DEVICE PRESTO INFLATION (MISCELLANEOUS) IMPLANT
DEVICE STARCLOSE SE CLOSURE (Vascular Products) ×3 IMPLANT
DEVICE TORQUE .025-.038 (MISCELLANEOUS) ×3 IMPLANT
GLIDEWIRE ANGLED SS 035X260CM (WIRE) ×3 IMPLANT
GUIDEWIRE SUPER STIFF .035X180 (WIRE) ×3 IMPLANT
IV NS 1000ML (IV SOLUTION) ×4
IV NS 1000ML BAXH (IV SOLUTION) ×2 IMPLANT
KIT CAROTID MANIFOLD (MISCELLANEOUS) ×3 IMPLANT
NEEDLE ENTRY 21GA 7CM ECHOTIP (NEEDLE) ×3 IMPLANT
PACK ANGIOGRAPHY (CUSTOM PROCEDURE TRAY) ×3 IMPLANT
SET INTRO CAPELLA COAXIAL (SET/KITS/TRAYS/PACK) ×3 IMPLANT
SHEATH BRITE TIP 6FRX11 (SHEATH) ×3 IMPLANT
SYR MEDRAD MARK 7 150ML (SYRINGE) ×3 IMPLANT
TUBING CONTRAST HIGH PRESS 72 (TUBING) ×3 IMPLANT
WIRE G VAS 035X260 STIFF (WIRE) ×3 IMPLANT
WIRE J 3MM .035X145CM (WIRE) ×3 IMPLANT

## 2019-01-06 NOTE — H&P (Signed)
[facility] History & Physical Update  The patient was interviewed and re-examined.  The patient's previous History and Physical has been reviewed and is unchanged.  There is no change in the plan of care. We plan to proceed with the scheduled procedure.  Hortencia Pilar, MD  01/06/2019, 8:13 AM

## 2019-01-06 NOTE — Progress Notes (Signed)
Observed patient's site oozing underneath PAD. Agricultural consultant and myself removed PAD and applied manual pressure for 15 minutes. Oozing stopped after 15 minutes and PAD was reapplied with 40 cc of air. Will extend lie flat time for an hour. Recheck of area after 15 minutes of application shows no additional oozing at this time. Will continue monitoring q19min for 1 hour as ordered.

## 2019-01-06 NOTE — Discharge Instructions (Signed)
Angiogram, Care After °This sheet gives you information about how to care for yourself after your procedure. Your doctor may also give you more specific instructions. If you have problems or questions, contact your doctor. °Follow these instructions at home: °Insertion site care °· Follow instructions from your doctor about how to take care of your long, thin tube (catheter) insertion area. Make sure you: °? Wash your hands with soap and water before you change your bandage (dressing). If you cannot use soap and water, use hand sanitizer. °? Change your bandage as told by your doctor. °? Leave stitches (sutures), skin glue, or skin tape (adhesive) strips in place. They may need to stay in place for 2 weeks or longer. If tape strips get loose and curl up, you may trim the loose edges. Do not remove tape strips completely unless your doctor says it is okay. °· Do not take baths, swim, or use a hot tub until your doctor says it is okay. °· You may shower 24-48 hours after the procedure or as told by your doctor. °? Gently wash the area with plain soap and water. °? Pat the area dry with a clean towel. °? Do not rub the area. This may cause bleeding. °· Do not apply powder or lotion to the area. Keep the area clean and dry. °· Check your insertion area every day for signs of infection. Check for: °? More redness, swelling, or pain. °? Fluid or blood. °? Warmth. °? Pus or a bad smell. °Activity °· Rest as told by your doctor, usually for 1-2 days. °· Do not lift anything that is heavier than 10 lbs. (4.5 kg) or as told by your doctor. °· Do not drive for 24 hours if you were given a medicine to help you relax (sedative). °· Do not drive or use heavy machinery while taking prescription pain medicine. °General instructions ° °· Go back to your normal activities as told by your doctor, usually in about a week. Ask your doctor what activities are safe for you. °· If the insertion area starts to bleed, lie flat and put  pressure on the area. If the bleeding does not stop, get help right away. This is an emergency. °· Drink enough fluid to keep your pee (urine) clear or pale yellow. °· Take over-the-counter and prescription medicines only as told by your doctor. °· Keep all follow-up visits as told by your doctor. This is important. °Contact a doctor if: °· You have a fever. °· You have chills. °· You have more redness, swelling, or pain around your insertion area. °· You have fluid or blood coming from your insertion area. °· The insertion area feels warm to the touch. °· You have pus or a bad smell coming from your insertion area. °· You have more bruising around the insertion area. °· Blood collects in the tissue around the insertion area (hematoma) that may be painful to the touch. °Get help right away if: °· You have a lot of pain in the insertion area. °· The insertion area swells very fast. °· The insertion area is bleeding, and the bleeding does not stop after holding steady pressure on the area. °· The area near or just beyond the insertion area becomes pale, cool, tingly, or numb. °These symptoms may be an emergency. Do not wait to see if the symptoms will go away. Get medical help right away. Call your local emergency services (911 in the [place] Do not drive yourself to the hospital. °  Summary °· After the procedure, it is common to have bruising and tenderness at the long, thin tube insertion area. °· After the procedure, it is important to rest and drink plenty of fluids. °· Do not take baths, swim, or use a hot tub until your doctor says it is okay to do so. You may shower 24-48 hours after the procedure or as told by your doctor. °· If the insertion area starts to bleed, lie flat and put pressure on the area. If the bleeding does not stop, get help right away. This is an emergency. °This information is not intended to replace advice given to you by your health care provider. Make sure you discuss any questions you have  with your health care provider. °Document Released: 10/04/2008 Document Revised: 07/02/2016 Document Reviewed: 07/02/2016 °Elsevier Interactive Patient Education © 2019 Else[employer] Inc[employer] °Femoral Site Care °This sheet gives you information about how to care for yourself after your procedure. Your health care provider may also give you more specific instructions. If you have problems or questions, contact your health care provider. °What can I expect after the procedure? °After the procedure, it is common to have: °· Bruising that usually fades within 1-2 weeks. °· Tenderness at the site. °Follow these instructions at home: °Wound care °· Follow instructions from your health care provider about how to take care of your insertion site. Make sure you: °? Wash your hands with soap and water before you change your bandage (dressing). If soap and water are not available, use hand sanitizer. °? Change your dressing as told by your health care provider. °? Leave stitches (sutures), skin glue, or adhesive strips in place. These skin closures may need to stay in place for 2 weeks or longer. If adhesive strip edges start to loosen and curl up, you may trim the loose edges. Do not remove adhesive strips completely unless your health care provider tells you to do that. °· Do not take baths, swim, or use a hot tub until your health care provider approves. °· You may shower 24-48 hours after the procedure or as told by your health care provider. °? Gently wash the site with plain soap and water. °? Pat the area dry with a clean towel. °? Do not rub the site. This may cause bleeding. °· Do not apply powder or lotion to the site. Keep the site clean and dry. °· Check your femoral site every day for signs of infection. Check for: °? Redness, swelling, or pain. °? Fluid or blood. °? Warmth. °? Pus or a bad smell. °Activity °· For the first 2-3 days after your procedure, or as long as directed: °? Avoid climbing stairs as much as  possible. °? Do not squat. °· Do not lift anything that is heavier than 10 lb (4.5 kg), or the limit that you are told, until your health care provider says that it is safe. °· Rest as directed. °? Avoid sitting for a long time without moving. Get up to take short walks every 1-2 hours. °· Do not drive for 24 hours if you were given a medicine to help you relax (sedative). °General instructions °· Take over-the-counter and prescription medicines only as told by your health care provider. °· Keep all follow-up visits as told by your health care provider. This is important. °Contact a health care provider if you have: °· A fever or chills. °· You have redness, swelling, or pain around your insertion site. °Get help right away if: °·   The catheter insertion area swells very fast. °· You pass out. °· You suddenly start to sweat or your skin gets clammy. °· The catheter insertion area is bleeding, and the bleeding does not stop when you hold steady pressure on the area. °· The area near or just beyond the catheter insertion site becomes pale, cool, tingly, or numb. °These symptoms may represent a serious problem that is an emergency. Do not wait to see if the symptoms will go away. Get medical help right away. Call your local emergency services (911 in the [place] Do not drive yourself to the hospital. °Summary °· After the procedure, it is common to have bruising that usually fades within 1-2 weeks. °· Check your femoral site every day for signs of infection. °· Do not lift anything that is heavier than 10 lb (4.5 kg), or the limit that you are told, until your health care provider says that it is safe. °This information is not intended to replace advice given to you by your health care provider. Make sure you discuss any questions you have with your health care provider. °Document Released: 03/11/2014 Document Revised: 07/21/2017 Document Reviewed: 07/21/2017 °Elsevier Interactive Patient Education © 2019 Elsevier  Inc[employer] °Moderate Conscious Sedation, Adult, Care After °These instructions provide you with information about caring for yourself after your procedure. Your health care provider may also give you more specific instructions. Your treatment has been planned according to current medical practices, but problems sometimes occur. Call your health care provider if you have any problems or questions after your procedure. °What can I expect after the procedure? °After your procedure, it is common: °· To feel sleepy for several hours. °· To feel clumsy and have poor balance for several hours. °· To have poor judgment for several hours. °· To vomit if you eat too soon. °Follow these instructions at home: °For at least 24 hours after the procedure: ° °· Do not: °? Participate in activities where you could fall or become injured. °? Drive. °? Use heavy machinery. °? Drink alcohol. °? Take sleeping pills or medicines that cause drowsiness. °? Make important decisions or sign legal documents. °? Take care of children on your own. °· Rest. °Eating and drinking °· Follow the diet recommended by your health care provider. °· If you vomit: °? Drink water, juice, or soup when you can drink without vomiting. °? Make sure you have little or no nausea before eating solid foods. °General instructions °· Have a responsible adult stay with you until you are awake and alert. °· Take over-the-counter and prescription medicines only as told by your health care provider. °· If you smoke, do not smoke without supervision. °· Keep all follow-up visits as told by your health care provider. This is important. °Contact a health care provider if: °· You keep feeling nauseous or you keep vomiting. °· You feel light-headed. °· You develop a rash. °· You have a fever. °Get help right away if: °· You have trouble breathing. °This information is not intended to replace advice given to you by your health care provider. Make sure you discuss any questions you  have with your health care provider. °Document Released: 04/28/2013 Document Revised: 12/11/2015 Document Reviewed: 10/28/2015 °Elsevier Interactive Patient Education © 2019 Elsevier Inc. ° °

## 2019-01-06 NOTE — Op Note (Signed)
[facility]   OPERATIVE NOTE  DATE: 01/06/2019  PRE-OPERATIVE DIAGNOSIS: 1.  Critical right carotid artery restenosis status post stenting   POST-OPERATIVE DIAGNOSIS: 1.  Moderate 30% right carotid artery restenosis status post stenting  PROCEDURE: 1.   Ultrasound Guidance for vascular access right femoral artery 2.   Catheter placement into right common carotid artery from right femoral approach 3.   Thoracic aortogram 4.   Right cervical and cerebral bilateral carotid angiogram 5.   StarClose closure device right femoral artery  SURGEON: Levora DredgeGregory , MD  ASSISTANT(S): Annice NeedyJason S Dew, MD  ANESTHESIA: Moderate conscious sedation  ESTIMATED BLOOD LOSS: 30 cc FLUORO TIME: 5.5 minutes  CONTRAST: 65 mils  MODERATE CONSCIOUS SEDATION TIME: Approximately 40 minutes using 4 of Versed and 75 Mcg of Fentanyl  FINDING(S): 1.  Moderate 30% in-stent restenosis right internal carotid artery normal intracranial angiogram, normal arch aortogram  SPECIMEN(S):  None  INDICATIONS:   Patient is a 68 y.o.male who presents with critical in-stent restenosis based on duplex ultrasound.  Catheter-based angiogram is performed for further evaluation and with the intention for treatment. Risks and benefits are discussed and informed consent was obtained.  DESCRIPTION: After obtaining full informed written consent, the patient was brought back to the operating room and placed supine upon the vascular suite table.  After obtaining adequate anesthesia, the patient was prepped and draped in the standard fashion.  Moderate conscious sedation was administered during a face to face encounter with the patient throughout the procedure with my supervision of the RN administering medicines and monitoring the patients vital signs and mental status throughout from the start of the procedure until the patient was taken to the recovery room.   A first assistant is required in order to allow  for a safe and more efficient operation.  Duties include wire manipulations as well as assistance with pinning the sheath and positioning the detector for proper angle, assistance and deploying the stent in the proper position and appropriate images.  Further duties include assisting with patient positioning during the procedure.  I believe that this procedure requires a first assistant in order for it to be performed at a level in keeping with the high standards of this institution.  The right femoral artery was visualized with ultrasound and found to be calcific but patent. It was then accessed under direct ultrasound guidance without difficulty with a micropuncture needle.  Stiffen micro sheath is then inserted.  An Amplatz wire and 6 French sheath were placed and a permanent ultrasound image of the common femoral artery was recorded prior to the puncture. The patient was given 6 000 units of intravenous heparin. A pigtail catheter was placed into the ascending aorta and an LAO projection thoracic aortogram was performed. This showed a type I arch no evidence of atherosclerotic stricture or stenosis of the ostia or proximal great vessels.  I then selected a H1 catheter and cannulated the innominate artery advancing into the mid right common carotid artery. Cervical and cerebral carotid angiography was then performed on the right side. The intracranial flow was found to be normal. The right cervical carotid artery was noted to have a well-positioned stent which is free of any kinks or fractures.  There is moderate 30% in-stent restenosis.  There is no evidence of stricture or stenosis at the proximal or distal margin of the stent.  The distal internal carotid artery appears normal coursing into the skull. The diagnostic catheter was removed. Oblique arteriogram was  performed of the right femoral artery and StarClose closure device was deployed in usual fashion with excellent hemostatic result. The patient  tolerated the procedure well and was taken to the recovery room in stable condition.  COMPLICATIONS: None  CONDITION: Stable   Hortencia Pilar 01/06/2019 9:24 AM   This note was created with Dragon Medical transcription system. Any errors in dictation are purely unintentional.

## 2019-01-07 ENCOUNTER — Telehealth (INDEPENDENT_AMBULATORY_CARE_PROVIDER_SITE_OTHER): Payer: Self-pay | Admitting: Vascular Surgery

## 2019-01-28 ENCOUNTER — Ambulatory Visit (INDEPENDENT_AMBULATORY_CARE_PROVIDER_SITE_OTHER): Payer: Medicare Other | Admitting: Vascular Surgery

## 2019-01-28 ENCOUNTER — Other Ambulatory Visit: Payer: Self-pay

## 2019-01-28 ENCOUNTER — Encounter (INDEPENDENT_AMBULATORY_CARE_PROVIDER_SITE_OTHER): Payer: Self-pay | Admitting: Vascular Surgery

## 2019-01-28 VITALS — BP 127/82 | HR 88 | Resp 16 | Ht 65.0 in | Wt 188.0 lb

## 2019-01-28 DIAGNOSIS — D6851 Activated protein C resistance: Secondary | ICD-10-CM

## 2019-01-28 DIAGNOSIS — I2510 Atherosclerotic heart disease of native coronary artery without angina pectoris: Secondary | ICD-10-CM

## 2019-01-28 DIAGNOSIS — I6523 Occlusion and stenosis of bilateral carotid arteries: Secondary | ICD-10-CM

## 2019-01-28 DIAGNOSIS — E785 Hyperlipidemia, unspecified: Secondary | ICD-10-CM

## 2019-01-28 DIAGNOSIS — Z7982 Long term (current) use of aspirin: Secondary | ICD-10-CM

## 2019-01-28 DIAGNOSIS — Z79899 Other long term (current) drug therapy: Secondary | ICD-10-CM

## 2019-01-28 DIAGNOSIS — Z7901 Long term (current) use of anticoagulants: Secondary | ICD-10-CM

## 2019-01-28 NOTE — Progress Notes (Signed)
MRN : [id number]  [place]. is a 68 y.o. (25-Nov-1950) male who presents with chief complaint of  Chief Complaint  Patient presents with  . Follow-up    3 wk  f/u no studies  .  History of Present Illness:   The patient is seen for follow up evaluation of carotid stenosis. His most recent duplex suggested that he had progressed and now had a critical restenosis of the right carotid.  Angiogram was done but this showed only 30-40% restenosis.  No intervention was done.  The patient denies amaurosis fugax since the angiogram. There is no recent history of TIA symptoms or focal motor deficits. There is no prior documented CVA.  The patient is taking enteric-coated aspirin 81 mg daily.  There is no history of migraine headaches. There is no history of seizures.  The patient has a history of coronary artery disease, no recent episodes of angina or shortness of breath. The patient denies PAD or claudication symptoms. There is a history of hyperlipidemia which is being treated with a statin.      Current Meds  Medication Sig  . apixaban (ELIQUIS) 5 MG TABS tablet Take 1 tablet (5 mg total) by mouth 2 (two) times daily.  [name] aspirin EC 81 MG tablet Take 81 mg by mouth daily.  . rosuvastatin (CRESTOR) 10 MG tablet Take 10 mg by mouth daily.    Past Medical History:  Diagnosis Date  . Carotid artery stenosis   . Diverticulosis   . DVT (deep venous thrombosis) ([place]) 2019  . Factor V Leiden ([place])   . Hyperlipidemia   . Plantar fasciitis   . Stroke ([place]) 06/2017  . Subacromial impingement of left shoulder 2019    Past Surgical History:  Procedure Laterality Date  . CAROTID PTA/STENT INTERVENTION Left 08/06/2017   Procedure: CAROTID PTA/STENT INTERVENTION;  Surgeon: Katha Cabal, MD;  Location: [place] [facility];  Service: Cardiovascular;  Laterality: Left;  . CAROTID PTA/STENT INTERVENTION Right 11/12/2017   Procedure: CAROTID PTA/STENT INTERVENTION;  Surgeon:  Katha Cabal, MD;  Location: [place] [facility];  Service: Cardiovascular;  Laterality: Right;  . CAROTID PTA/STENT INTERVENTION Right 01/06/2019   Procedure: CAROTID PTA/STENT INTERVENTION;  Surgeon: Katha Cabal, MD;  Location: [place] [facility];  Service: Cardiovascular;  Laterality: Right;  . COLONOSCOPY WITH PROPOFOL N/A 03/17/2015   Procedure: COLONOSCOPY WITH PROPOFOL;  Surgeon: Lollie Sails, MD;  Location: [facility] ENDOSCOPY;  Service: Endoscopy;  Laterality: N/A;  . VASECTOMY      Social History Social History   Tobacco Use  . Smoking status: Never Smoker  . Smokeless tobacco: Former Network engineer Use Topics  . Alcohol use: Yes    Comment: occasional beer  . Drug use: No    Family History Family History  Problem Relation Age of Onset  . CAD Mother   . Diabetes Mother   . CAD Father   . CAD Sister     No Known Allergies   REVIEW OF SYSTEMS (Negative unless checked)  Constitutional: [] Weight loss  [] Fever  [] Chills Cardiac: [] Chest pain   [] Chest pressure   [] Palpitations   [] Shortness of breath when laying flat   [] Shortness of breath with exertion. Vascular:  [] Pain in legs with walking   [] Pain in legs at rest  [] History of DVT   [] Phlebitis   [] Swelling in legs   [] Varicose veins   [] Non-healing ulcers Pulmonary:   [] Uses home oxygen   []   Productive cough   [] Hemoptysis   [] Wheeze  [] COPD   [] Asthma Neurologic:  [] Dizziness   [] Seizures   [] History of stroke   [] History of TIA  [] Aphasia   [] Vissual changes   [] Weakness or numbness in arm   [] Weakness or numbness in leg Musculoskeletal:   [] Joint swelling   [] Joint pain   [] Low back pain Hematologic:  [] Easy bruising  [] Easy bleeding   [] Hypercoagulable state   [] Anemic Gastrointestinal:  [] Diarrhea   [] Vomiting  [] Gastroesophageal reflux/heartburn   [] Difficulty swallowing. Genitourinary:  [] Chronic kidney disease   [] Difficult urination  [] Frequent urination   [] Blood in urine Skin:  [] Rashes    [] Ulcers  Psychological:  [] History of anxiety   []  History of major depression.  Physical Examination  Vitals:   01/28/19 1321  BP: 127/82  Pulse: 88  Resp: 16  Weight: 188 lb (85.3 kg)  Height: 5\' 5"  (1.651 m)   Body mass index is 31.28 kg/m. Gen: WD/WN, NAD Head: [place]/AT, No temporalis wasting.  Ear/Nose/Throat: Hearing grossly intact, nares w/o erythema or drainage Eyes: PER, EOMI, sclera nonicteric.  Neck: Supple, no large masses.   Pulmonary:  Good air movement, no audible wheezing bilaterally, no use of accessory muscles.  Cardiac: RRR, no JVD Vascular: bilateral carotid bruits Vessel Right Left  Radial Palpable Palpable  Brachial Palpable Palpable  Carotid Palpable Palpable  Gastrointestinal: Non-distended. No guarding/no peritoneal signs.  Musculoskeletal: M/S 5/5 throughout.  No deformity or atrophy.  Neurologic: CN 2-12 intact. Symmetrical.  Speech is fluent. Motor exam as listed above. Psychiatric: Judgment intact, Mood & affect appropriate for pt's clinical situation. Dermatologic: No rashes or ulcers noted.  No changes consistent with cellulitis. Lymph : No lichenification or skin changes of chronic lymphedema.  CBC Lab Results  Component Value Date   WBC 6.0 11/13/2017   HGB 13.3 11/13/2017   HCT 39.0 (L) 11/13/2017   MCV 95.8 11/13/2017   PLT 176 11/13/2017    BMET    Component Value Date/Time   NA 136 11/13/2017 0457   K 4.0 11/13/2017 0457   CL 110 11/13/2017 0457   CO2 22 11/13/2017 0457   GLUCOSE 93 11/13/2017 0457   BUN 17 01/01/2019 1349   CREATININE 1.02 01/01/2019 1349   CALCIUM 7.9 (L) 11/13/2017 0457   GFRNONAA >60 01/01/2019 1349   GFRAA >60 01/01/2019 1349   CrCl cannot be calculated (Patient's most recent lab result is older than the maximum 21 days allowed.).  COAG Lab Results  Component Value Date   INR 1.02 11/13/2017   INR 1.61 11/06/2017   INR 0.98 08/05/2017    Radiology No results found.    Assessment/Plan 1.  Bilateral carotid artery stenosis Recommend:  Given the patient's asymptomatic subcritical stenosis no further invasive testing or surgery at this time.  Duplex ultrasound shows <50% ICA  stenosis bilaterally.  Continue antiplatelet therapy as prescribed Continue management of CAD, HTN and Hyperlipidemia Healthy heart diet,  encouraged exercise at least 4 times per week Follow up in 12 months with duplex ultrasound and physical exam   - VAS US CAROTID; Future  2. Factor V Leiden (HCC) Continue anticoagulation as ordered  3. Hyperlipidemia, unspecified hyperlipidemia type Continue statin as ordered and reviewed, no changes at this time    Levora DredgeGregory Margaurite Salido, MD  01/28/2019 1:31 PM

## 2019-06-03 ENCOUNTER — Telehealth (INDEPENDENT_AMBULATORY_CARE_PROVIDER_SITE_OTHER): Payer: Self-pay

## 2019-06-03 NOTE — Telephone Encounter (Signed)
Unfortunately, they have not made eliquis in a generic form yet.  Although it has a generic name (which is required by all drug makers) the manufacturer of eliquis still holds a patent that stops other companies from producing it, which it where generics come from. It is estimated that it may be 4-6 years before a generic is available.  We do have 10 co pay cards, which may work if you would like to pick up one from the office.  Otherwise, the only cheaper alternative would be coumadin.

## 2019-06-03 NOTE — Telephone Encounter (Signed)
I called the patient to inform him with medical recommendations and his wife informed that the generic brand was approved by FDA but I spoke with Eulogio Ditch NP she stated yes the medication was approved but it will not release until 2023 or 2026. Patient was made aware with information and verbalized understanding.

## 2019-06-14 DIAGNOSIS — G8191 Hemiplegia, unspecified affecting right dominant side: Secondary | ICD-10-CM | POA: Insufficient documentation

## 2019-12-22 IMAGING — CT CT ANGIO NECK
3 of 7 series · 10 of 36 positions shown · IV contrast (APPLIED)
Comparison: MRI brain and MRA head and neck in 07/18/2017.

CLINICAL DATA: Bilateral carotid bifurcation stenosis. Left
watershed territory infarcts. Factor V Leiden deficiency.

EXAM:
CT ANGIOGRAPHY NECK
TECHNIQUE: Multidetector CT imaging of the neck was performed using the
standard protocol during bolus administration of intravenous
contrast. Multiplanar CT image reconstructions and MIPs were
obtained to evaluate the vascular anatomy. Carotid stenosis
measurements (when applicable) are obtained utilizing NASCET
criteria, using the distal internal carotid diameter as the
denominator.
CONTRAST:  75mL QZS5AZ-VGF IOPAMIDOL (QZS5AZ-VGF) INJECTION 76%

[Series 6: cta neck · axial · 0.55mm/px · z∈[-202,-130]mm · 2 of 109 slices shown]
[im 37/109  soft-tissue]
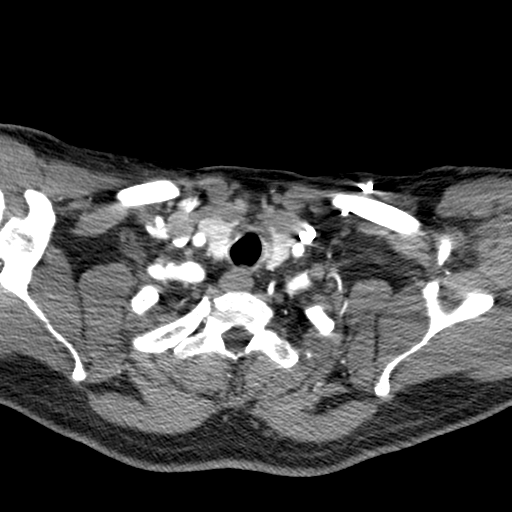
[im 73/109  soft-tissue]
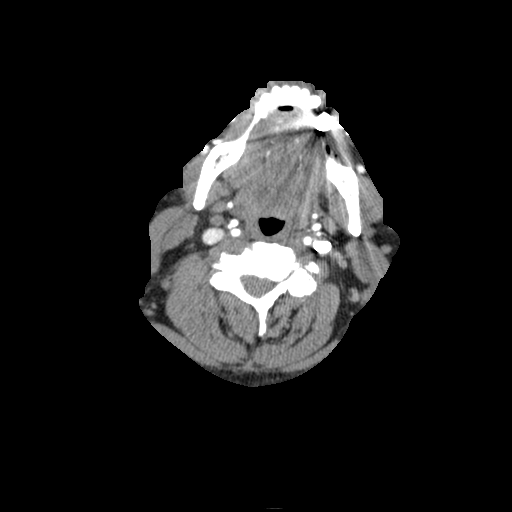

[Series 8: ax thin · axial · 0.40mm/px · z∈[-249,-98]mm · 6 of 226 slices shown]
[im 33/226  soft-tissue]
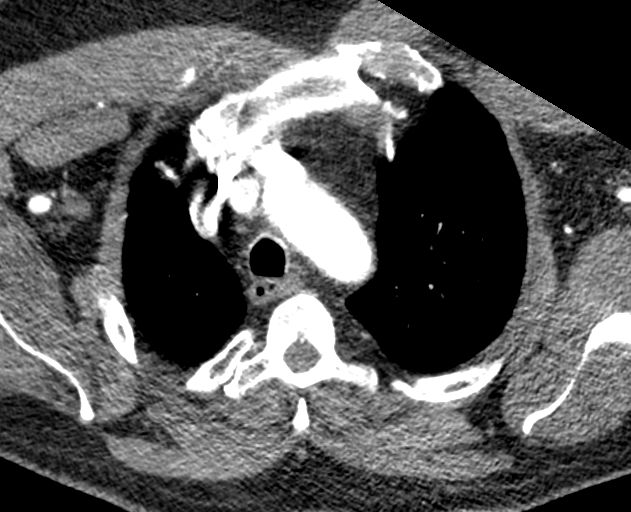
[im 65/226  bone]
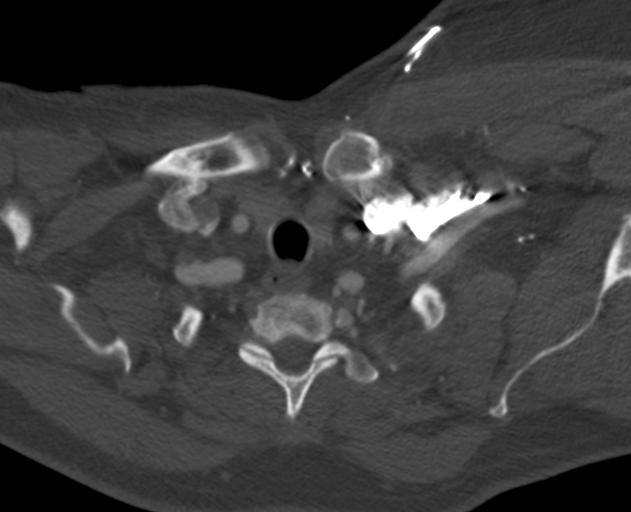
[im 97/226  soft-tissue]
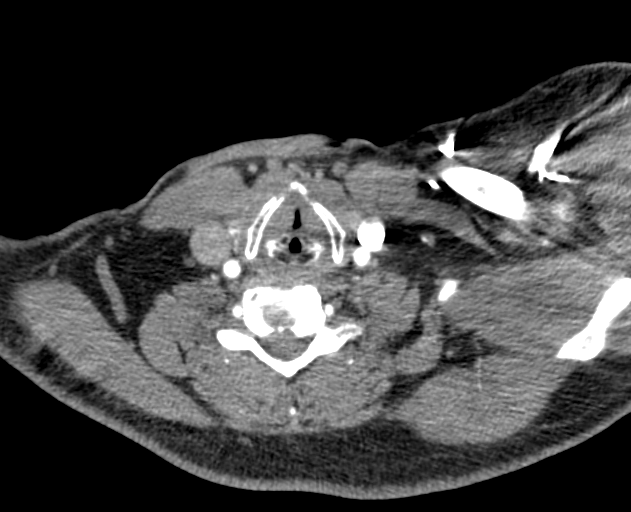
[im 129/226  bone]
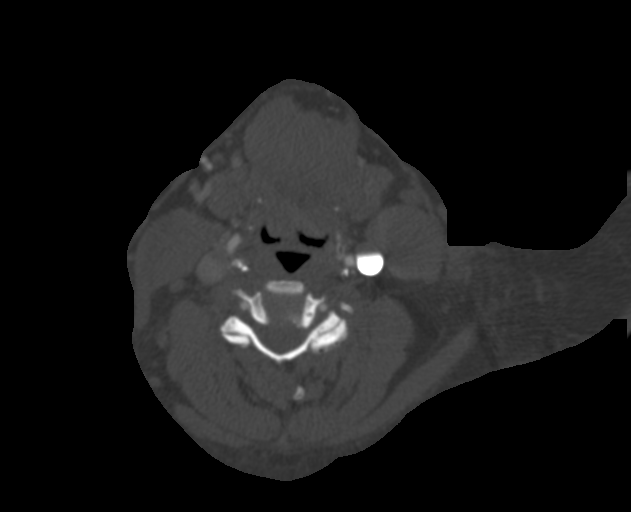
[im 161/226  soft-tissue]
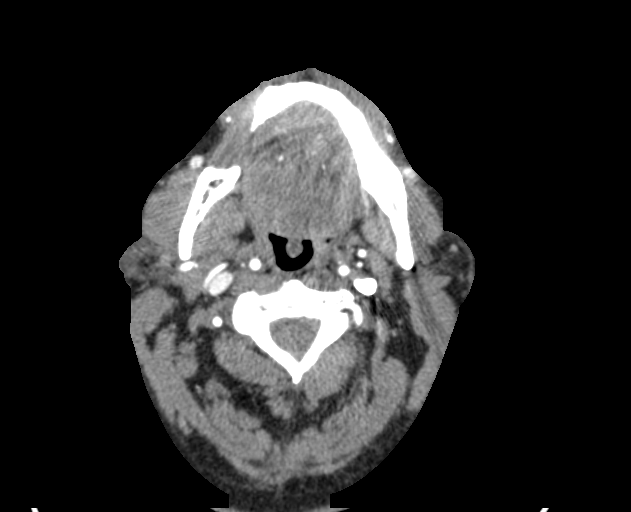
[im 193/226  bone]
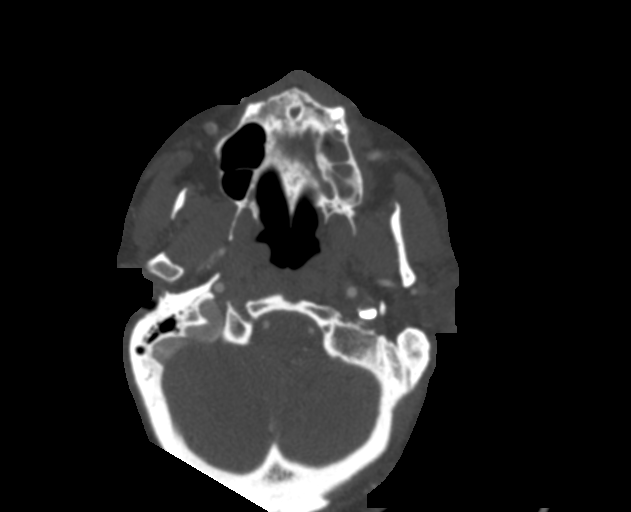

[Series 10: sagittal thin · sagittal · 0.40mm/px · 2 of 200 slices shown]
[im 69/200  soft-tissue]
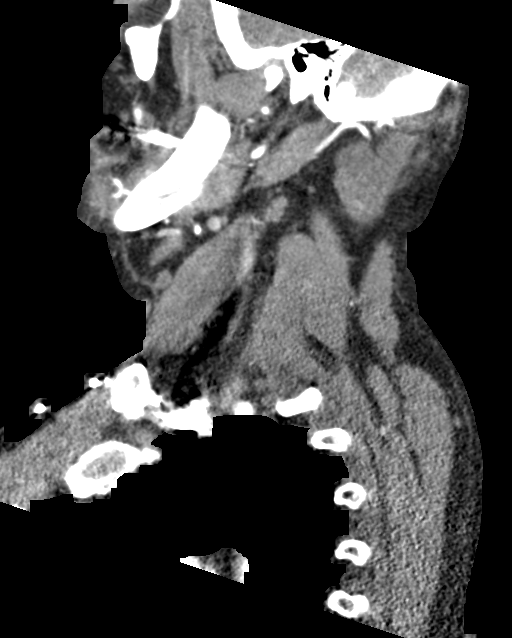
[im 132/200  soft-tissue]
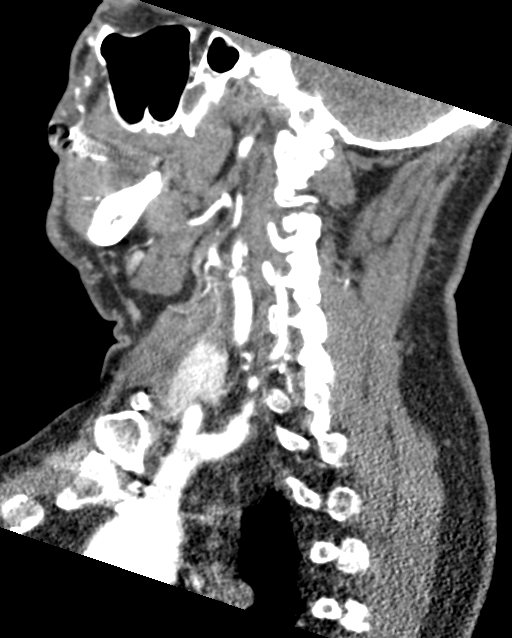

[10 of 36 positions shown; findings below may reference images not displayed]

FINDINGS: Aortic arch: The a 3 vessel arch configuration is present. There is
minimal calcification along the underside of the arch. No
significant stenosis or aneurysm is present.

Right carotid system: The right common carotid artery is within
normal limits. A high-grade, near occlusive, stenosis is present at
the origin of the right internal carotid artery. The lumen is not
measurable. The more distal right ICA is within normal limits. Focal
calcification is present in the petrous segment, just above the
skullbase. There is no significant stenosis at that level.

Left carotid system: The left common carotid artery is within normal
limits. Dense calcifications and soft tissue plaque are present at
the bifurcation. There is a high-grade, near occlusive stenosis of
the left internal carotid artery with extensive soft plaque
extending superiorly over 10 mm. No significant distal stenosis is
present. The distal right internal carotid artery is slightly more
narrow than on the left. Additional calcifications are present
within the petrous segment without a significant tandem stenosis.

Vertebral arteries: The vertebral artery is originate from the
subclavian artery is without significant stenosis. Right vertebral
artery is slightly dominant to the right. There is no significant
stenosis in the neck. Vertebrobasilar junction is normal. PICA
origins are visualized and normal bilaterally.

Skeleton: Dextroconvex curvature is present in the cervical spine
with asymmetric uncovertebral disease on the left at C3-4 and C4-5
in particular. Vertebral body heights are maintained. There is
congenital fusion at C2-3.

Other neck: The soft tissues of the neck are otherwise unremarkable.
The thyroid is within normal limits. There is no significant
cervical adenopathy.

Upper chest: The lung apices are clear.
IMPRESSION: 1. Severe, near occlusive stenosis of the proximal internal carotid
artery is bilaterally.
2. Soft tissue or fibrotic plaque more extensive left than right.
This may represent acute soft plaque within the lumen on the left
distal to the high-grade stenosis.
3. Slight decreased caliber of the more distal left internal carotid
artery compared to the right suggests a greater hemodynamic effect
on the left, corresponding to the site of the patient's recent
watershed type infarctions.
These results were called by telephone at the time of interpretation
on 07/20/2017 at [DATE] to Court. KRIATA UTAH and JHON EDISSON QUENORAN
, who verbally acknowledged these results.

## 2020-01-30 NOTE — Progress Notes (Signed)
MRN : [id number]  Bryan POLLITT Sr. is a 69 y.o. ([birth year]) male who presents with chief complaint of No chief complaint on file. [name]  History of Present Illness:   The patient is seen for follow up evaluation of carotid stenosis. He is s/p  RICA stenting on 11/12/2017 and LICA stenting on 08/06/2017.  A  Duplex in May, 2020 suggested that he had progressed and now had a critical restenosis of the right carotid.  Angiogram was done but this showed only 30-40% restenosis.  No intervention was done.  The patient denies amaurosis fugax since the angiogram. There is no recent history of TIA symptoms or focal motor deficits. There is no prior documented CVA.  The patient is taking enteric-coated aspirin 81 mg daily.  There is no history of migraine headaches. There is no history of seizures.  The patient has a history of coronary artery disease, no recent episodes of angina or shortness of breath. The patient denies PAD or claudication symptoms. There is a history of hyperlipidemia which is being treated with a statin.   Duplex ultrasound today shows less than 50% stenosis bilaterally  No outpatient medications have been marked as taking for the 01/31/20 encounter (Appointment) with Gilda Crease, Latina Craver, MD.    Past Medical History:  Diagnosis Date  . Carotid artery stenosis   . Diverticulosis   . DVT (deep venous thrombosis) ([facility]) 2019  . Factor V Leiden ([facility])   . Hyperlipidemia   . Plantar fasciitis   . Stroke ([facility]) 06/2017  . Subacromial impingement of left shoulder 2019    Past Surgical History:  Procedure Laterality Date  . CAROTID PTA/STENT INTERVENTION Left 08/06/2017   Procedure: CAROTID PTA/STENT INTERVENTION;  Surgeon: Renford Dills, MD;  Location: [facility] INVASIVE [facility];  Service: Cardiovascular;  Laterality: Left;  . CAROTID PTA/STENT INTERVENTION Right 11/12/2017   Procedure: CAROTID PTA/STENT INTERVENTION;  Surgeon: Renford Dills, MD;  Location: [facility]  INVASIVE CV LAB;  Service: Cardiovascular;  Laterality: Right;  . CAROTID PTA/STENT INTERVENTION Right 01/06/2019   Procedure: CAROTID PTA/STENT INTERVENTION;  Surgeon: Renford Dills, MD;  Location: [facility] INV[facility]IVE [facility];  Service: Cardiovascular;  Laterality: Right;  . COLONOSCOPY WITH PROPOFOL N/A 03/17/2015   Procedure: COLONOSCOPY WITH PROPOFOL;  Surgeon: Christena Deem, MD;  Location: [facility] ENDOSCOPY;  Service: Endoscopy;  Laterality: N/A;  . VASECTOMY      Social History Social History   Tobacco Use  . Smoking status: Never Smoker  . Smokeless tobacco: Former Clinical biochemist  . Vaping Use: Never used  Substance Use Topics  . Alcohol use: Yes    Comment: occasional beer  . Drug use: No    Family History Family History  Problem Relation Age of Onset  . CAD Mother   . Diabetes Mother   . CAD Father   . CAD Sister     No Known Allergies   REVIEW OF SYSTEMS (Negative unless checked)  Constitutional: [] Weight loss  [] Fever  [] Chills Cardiac: [] Chest pain   [] Chest pressure   [] Palpitations   [] Shortness of breath when laying flat   [] Shortness of breath with exertion. Vascular:  [] Pain in legs with walking   [] Pain in legs at rest  [] History of DVT   [] Phlebitis   [] Swelling in legs   [] Varicose veins   [] Non-healing ulcers Pulmonary:   [] Uses home oxygen   [] Productive cough   [] Hemoptysis   [] Wheeze  [] COPD   [] Asthma Neurologic:  [] Dizziness   []   Seizures   [] History of stroke   [x] History of TIA  [] Aphasia   [] Vissual changes   [] Weakness or numbness in arm   [] Weakness or numbness in leg Musculoskeletal:   [] Joint swelling   [] Joint pain   [] Low back pain Hematologic:  [] Easy bruising  [] Easy bleeding   [] Hypercoagulable state   [] Anemic Gastrointestinal:  [] Diarrhea   [] Vomiting  [] Gastroesophageal reflux/heartburn   [] Difficulty swallowing. Genitourinary:  [] Chronic kidney disease   [] Difficult urination  [] Frequent urination   [] Blood in urine Skin:  [] Rashes    [] Ulcers  Psychological:  [] History of anxiety   []  History of major depression.  Physical Examination  There were no vitals filed for this visit. There is no height or weight on file to calculate BMI. Gen: WD/WN, NAD Head: [place]/AT, No temporalis wasting.  Ear/Nose/Throat: Hearing grossly intact, nares w/o erythema or drainage Eyes: PER, EOMI, sclera nonicteric.  Neck: Supple, no large masses.   Pulmonary:  Good air movement, no audible wheezing bilaterally, no use of accessory muscles.  Cardiac: RRR, no JVD Vascular: bilateral carotid bruuits Vessel Right Left  Radial Palpable Palpable  Gastrointestinal: Non-distended. No guarding/no peritoneal signs.  Musculoskeletal: M/S 5/5 throughout.  No deformity or atrophy.  Neurologic: CN 2-12 intact. Symmetrical.  Speech is fluent. Motor exam as listed above. Psychiatric: Judgment intact, Mood & affect appropriate for pt's clinical situation. Dermatologic: No rashes or ulcers noted.  No changes consistent with cellulitis.  CBC Lab Results  Component Value Date   WBC 6.0 11/13/2017   HGB 13.3 11/13/2017   HCT 39.0 (L) 11/13/2017   MCV 95.8 11/13/2017   PLT 176 11/13/2017    BMET    Component Value Date/Time   NA 136 11/13/2017 0457   K 4.0 11/13/2017 0457   CL 110 11/13/2017 0457   CO2 22 11/13/2017 0457   GLUCOSE 93 11/13/2017 0457   BUN 17 01/01/2019 1349   CREATININE 1.02 01/01/2019 1349   CALCIUM 7.9 (L) 11/13/2017 0457   GFRNONAA >60 01/01/2019 1349   GFRAA >60 01/01/2019 1349   CrCl cannot be calculated (Patient's most recent lab result is older than the maximum 21 days allowed.).  COAG Lab Results  Component Value Date   INR 1.02 11/13/2017   INR 1.61 11/06/2017   INR 0.98 08/05/2017    Radiology No results found.  Assessment/Plan 1. Symptomatic stenosis of both carotid arteries without infarction Recommend:  Given the patient's successful intervention bilaterally and the subcritical stenosis no further  invasive testing or surgery at this time.  Duplex ultrasound shows <50% stenosis bilaterally.  Continue antiplatelet therapy as prescribed Continue management of CAD, HTN and Hyperlipidemia Healthy heart diet,  encouraged exercise at least 4 times per week Follow up in 12 months with duplex ultrasound and physical exam  - VAS CAROTID; Future  2. Hyperlipidemia, unspecified hyperlipidemia type Continue statin as ordered and reviewed, no changes at this time   3. Factor V Leiden ([facility]) Continue anticoagulation as ordered   , MD  01/30/2020 4:05 PM

## 2020-01-31 ENCOUNTER — Encounter (INDEPENDENT_AMBULATORY_CARE_PROVIDER_SITE_OTHER): Payer: Self-pay | Admitting: Vascular Surgery

## 2020-01-31 ENCOUNTER — Ambulatory Visit (INDEPENDENT_AMBULATORY_CARE_PROVIDER_SITE_OTHER): Payer: Medicare HMO

## 2020-01-31 ENCOUNTER — Ambulatory Visit (INDEPENDENT_AMBULATORY_CARE_PROVIDER_SITE_OTHER): Payer: Medicare HMO | Admitting: Vascular Surgery

## 2020-01-31 ENCOUNTER — Other Ambulatory Visit: Payer: Self-pay

## 2020-01-31 VITALS — BP 139/85 | HR 86 | Resp 16 | Wt 193.0 lb

## 2020-01-31 DIAGNOSIS — D6851 Activated protein C resistance: Secondary | ICD-10-CM

## 2020-01-31 DIAGNOSIS — E785 Hyperlipidemia, unspecified: Secondary | ICD-10-CM

## 2020-01-31 DIAGNOSIS — I6523 Occlusion and stenosis of bilateral carotid arteries: Secondary | ICD-10-CM | POA: Diagnosis not present

## 2021-01-30 ENCOUNTER — Ambulatory Visit (INDEPENDENT_AMBULATORY_CARE_PROVIDER_SITE_OTHER): Payer: Medicare HMO

## 2021-01-30 ENCOUNTER — Other Ambulatory Visit: Payer: Self-pay

## 2021-01-30 ENCOUNTER — Ambulatory Visit (INDEPENDENT_AMBULATORY_CARE_PROVIDER_SITE_OTHER): Payer: Medicare HMO | Admitting: Nurse Practitioner

## 2021-01-30 ENCOUNTER — Encounter (INDEPENDENT_AMBULATORY_CARE_PROVIDER_SITE_OTHER): Payer: Self-pay | Admitting: Nurse Practitioner

## 2021-01-30 VITALS — BP 137/88 | HR 69 | Ht 65.0 in | Wt 195.0 lb

## 2021-01-30 DIAGNOSIS — E785 Hyperlipidemia, unspecified: Secondary | ICD-10-CM

## 2021-01-30 DIAGNOSIS — I6523 Occlusion and stenosis of bilateral carotid arteries: Secondary | ICD-10-CM

## 2021-01-30 DIAGNOSIS — D6851 Activated protein C resistance: Secondary | ICD-10-CM | POA: Diagnosis not present

## 2021-02-04 ENCOUNTER — Encounter (INDEPENDENT_AMBULATORY_CARE_PROVIDER_SITE_OTHER): Payer: Self-pay | Admitting: Nurse Practitioner

## 2021-02-04 NOTE — Progress Notes (Signed)
Subjective:    Patient ID: Bryan Birch Sr., male    DOB: [birth year], 70 y.o.   MRN: [id number] Chief Complaint  Patient presents with   Follow-up    21yr carotid U/S    Bryan Holland is a 70 year old male that is seen for follow up evaluation of carotid stenosis. The carotid stenosis followed by ultrasound.   The patient denies amaurosis fugax. There is no recent history of TIA symptoms or focal motor deficits. There is no prior documented CVA.  The patient is taking enteric-coated aspirin 81 mg daily.  There is no history of migraine headaches. There is no history of seizures.  The patient has a history of coronary artery disease, no recent episodes of angina or shortness of breath. The patient denies PAD or claudication symptoms. There is a history of hyperlipidemia which is being treated with a statin.    Carotid Duplex done today shows 40-59% stenosis bilaterally.  Both stents are open and patent.  No change compared to last study in 2021   Review of Systems  Neurological:  Positive for weakness.  All other systems reviewed and are negative.     Objective:   Physical Exam Vitals reviewed.  HENT:     Head: Normocephalic.  Cardiovascular:     Rate and Rhythm: Normal rate.     Pulses: Normal pulses.  Pulmonary:     Effort: Pulmonary effort is normal.  Neurological:     Mental Status: He is alert and oriented to person, place, and time.     Motor: Weakness present.  Psychiatric:        Mood and Affect: Mood normal.        Behavior: Behavior normal.        Thought Content: Thought content normal.        Judgment: Judgment normal.    BP 137/88   Pulse 69   Ht 5\' 5"  (1.651 m)   Wt 195 lb (88.5 kg)   BMI 32.45 kg/m   Past Medical History:  Diagnosis Date   Carotid artery stenosis    Diverticulosis    DVT (deep venous thrombosis) ([facility]) 2019   Factor V Leiden ([facility])    Hyperlipidemia    Plantar fasciitis    Stroke ([facility]) 06/2017   Subacromial  impingement of left shoulder 2019    Social History   Socioeconomic History   Marital status: Married    Spouse name: Not on file   Number of children: Not on file   Years of education: Not on file   Highest education level: Not on file  Occupational History   Not on file  Tobacco Use   Smoking status: Never   Smokeless tobacco: Former    Quit date: 1990  Vaping Use   Vaping Use: Never used  Substance and Sexual Activity   Alcohol use: Yes    Comment: occasional beer   Drug use: No   Sexual activity: Not on file  Other Topics Concern   Not on file  Social History Narrative   Not on file   Social Determinants of Health   Financial Resource Strain: Not on file  Food Insecurity: Not on file  Transportation Needs: Not on file  Physical Activity: Not on file  Stress: Not on file  Social Connections: Not on file  Intimate Partner Violence: Not on file    Past Surgical History:  Procedure Laterality Date   CAROTID PTA/STENT INTERVENTION Left 08/06/2017   Procedure: CAROTID PTA/STENT  INTERVENTION;  Surgeon: Renford Dills, MD;  Location: [facility] INVASIVE [facility];  Service: Cardiovascular;  Laterality: Left;   CAROTID PTA/STENT INTERVENTION Right 11/12/2017   Procedure: CAROTID PTA/STENT INTERVENTION;  Surgeon: Renford Dills, MD;  Location: [facility] INV[facility];  Service: Cardiovascular;  Laterality: Right;   CAROTID PTA/STENT INTERVENTION Right 01/06/2019   Procedure: CAROTID PTA/STENT INTERVENTION;  Surgeon: Renford Dills, MD;  Location: [facility] INV[facility];  Service: Cardiovascular;  Laterality: Right;   COLONOSCOPY WITH PROPOFOL N/A 03/17/2015   Procedure: COLONOSCOPY WITH PROPOFOL;  Surgeon: Christena Deem, MD;  Location: [facility] ENDOSCOPY;  Service: Endoscopy;  Laterality: N/A;   VASECTOMY      Family History  Problem Relation Age of Onset   CAD Mother    Diabetes Mother    CAD Father    CAD Sister     No Known Allergies  CBC Latest Ref Rng & Units  11/13/2017 11/12/2017 08/07/2017  WBC 3.8 - 10.6 K/uL 6.0 - 7.4  Hemoglobin 13.0 - 18.0 g/dL 25.0 53.9 76.7  Hematocrit 40.0 - 52.0 % 39.0(L) 40.0 41.4  Platelets 150 - 440 K/uL 176 - 169      CMP     Component Value Date/Time   NA 136 11/13/2017 0457   K 4.0 11/13/2017 0457   CL 110 11/13/2017 0457   CO2 22 11/13/2017 0457   GLUCOSE 93 11/13/2017 0457   BUN 17 01/01/2019 1349   CREATININE 1.02 01/01/2019 1349   CALCIUM 7.9 (L) 11/13/2017 0457   PROT 7.6 07/18/2017 1644   ALBUMIN 4.4 07/18/2017 1644   AST 29 07/18/2017 1644   ALT 33 07/18/2017 1644   ALKPHOS 84 07/18/2017 1644   BILITOT 0.4 07/18/2017 1644   GFRNONAA >60 01/01/2019 1349   GFRAA >60 01/01/2019 1349     No results found.     Assessment & Plan:   1. Bilateral carotid artery stenosis Recommend:  Given the patient's asymptomatic subcritical stenosis no further invasive testing or surgery at this time.  Duplex ultrasound shows 40-59% stenosis bilaterally.  Continue antiplatelet therapy as prescribed Continue management of CAD, HTN and Hyperlipidemia Healthy heart diet,  encouraged exercise at least 4 times per week Follow up in 12 months with duplex ultrasound and physical exam    2. Hyperlipidemia, unspecified hyperlipidemia type Continue statin as ordered and reviewed, no changes at this time   3. Factor V Leiden [facility]) Patient is on chronic anticoagulation.  No changes at this time   Current Outpatient Medications on File Prior to Visit  Medication Sig Dispense Refill   apixaban (ELIQUIS) 5 MG TABS tablet Take 1 tablet (5 mg total) by mouth 2 (two) times daily. 180 tablet 1   rosuvastatin (CRESTOR) 10 MG tablet Take 10 mg by mouth daily.     aspirin EC 81 MG tablet Take 81 mg by mouth daily. (Patient not taking: Reported on 01/30/2021)     clopidogrel (PLAVIX) 75 MG tablet Take 75 mg by mouth daily.  (Patient not taking: Reported on 01/30/2021)     No current facility-administered medications on  file prior to visit.    There are no Patient Instructions on file for this visit. No follow-ups on file.   [name], NP

## 2021-04-02 ENCOUNTER — Telehealth (INDEPENDENT_AMBULATORY_CARE_PROVIDER_SITE_OTHER): Payer: Self-pay

## 2021-04-02 NOTE — Telephone Encounter (Signed)
Patient reach to informed our office that Dr Hyacinth Meeker refilled his Eliquis for 30 days. The patient was not sure if our office or Dr Hyacinth Meeker was the one that monitor the medication. Patient has upcoming appointment with Dr Hyacinth Meeker on 04/17/21.

## 2021-05-10 ENCOUNTER — Telehealth (INDEPENDENT_AMBULATORY_CARE_PROVIDER_SITE_OTHER): Payer: Self-pay

## 2021-05-10 NOTE — Telephone Encounter (Signed)
Spoke with the patient and explained per Sheppard Plumber NP that the medication is effective taking it twice daily every day. Patient understood.

## 2021-05-10 NOTE — Telephone Encounter (Signed)
Patient called in wanting to ask provider if he could or needed to cut back on his medication from taking 2x daily to taking it once a day    Please call and advise    apixaban (ELIQUIS) 5 MG TABS tablet

## 2022-02-04 ENCOUNTER — Ambulatory Visit (INDEPENDENT_AMBULATORY_CARE_PROVIDER_SITE_OTHER): Payer: Medicare HMO | Admitting: Vascular Surgery

## 2022-02-04 ENCOUNTER — Encounter (INDEPENDENT_AMBULATORY_CARE_PROVIDER_SITE_OTHER): Payer: Medicare HMO

## 2022-05-20 ENCOUNTER — Encounter (INDEPENDENT_AMBULATORY_CARE_PROVIDER_SITE_OTHER): Payer: Self-pay
